# Patient Record
Sex: Male | Born: 1995 | Race: White | Hispanic: No | Smoking: Former smoker
Health system: Southern US, Community
[De-identification: ages and names within clinical notes are randomized; demographics above are authoritative.]

## PROBLEM LIST (undated history)

## (undated) DIAGNOSIS — K219 Gastro-esophageal reflux disease without esophagitis: Secondary | ICD-10-CM

## (undated) DIAGNOSIS — T7840XA Allergy, unspecified, initial encounter: Secondary | ICD-10-CM

## (undated) DIAGNOSIS — I10 Essential (primary) hypertension: Secondary | ICD-10-CM

## (undated) DIAGNOSIS — R42 Dizziness and giddiness: Secondary | ICD-10-CM

## (undated) HISTORY — DX: Gastro-esophageal reflux disease without esophagitis: K21.9

## (undated) HISTORY — DX: Essential (primary) hypertension: I10

## (undated) HISTORY — PX: ABDOMINAL WALL DEFECT REPAIR: SHX53

## (undated) HISTORY — DX: Allergy, unspecified, initial encounter: T78.40XA

---
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
## 2017-02-07 ENCOUNTER — Encounter: Payer: Self-pay | Admitting: Emergency Medicine

## 2017-02-07 ENCOUNTER — Emergency Department: Payer: BLUE CROSS/BLUE SHIELD

## 2017-02-07 ENCOUNTER — Emergency Department
Admission: EM | Admit: 2017-02-07 | Discharge: 2017-02-07 | Disposition: A | Discharge status: Home / Self Care | Payer: BLUE CROSS/BLUE SHIELD | Attending: Emergency Medicine | Admitting: Emergency Medicine

## 2017-02-07 DIAGNOSIS — R091 Pleurisy: Secondary | ICD-10-CM | POA: Diagnosis not present

## 2017-02-07 DIAGNOSIS — R079 Chest pain, unspecified: Secondary | ICD-10-CM | POA: Diagnosis present

## 2017-02-07 DIAGNOSIS — J00 Acute nasopharyngitis [common cold]: Secondary | ICD-10-CM | POA: Insufficient documentation

## 2017-02-07 DIAGNOSIS — R0789 Other chest pain: Secondary | ICD-10-CM

## 2017-02-07 DIAGNOSIS — Z87891 Personal history of nicotine dependence: Secondary | ICD-10-CM | POA: Insufficient documentation

## 2017-02-07 LAB — CBC
HEMATOCRIT: 45.6 % (ref 40.0–52.0)
HEMOGLOBIN: 14.9 g/dL (ref 13.0–18.0)
MCH: 29.4 pg (ref 26.0–34.0)
MCHC: 32.7 g/dL (ref 32.0–36.0)
MCV: 89.8 fL (ref 80.0–100.0)
Platelets: 197 10*3/uL (ref 150–440)
RBC: 5.08 MIL/uL (ref 4.40–5.90)
RDW: 13 % (ref 11.5–14.5)
WBC: 12 10*3/uL — AB (ref 3.8–10.6)

## 2017-02-07 LAB — FIBRIN DERIVATIVES D-DIMER (ARMC ONLY): FIBRIN DERIVATIVES D-DIMER (ARMC): 228.77 ng{FEU}/mL (ref 0.00–499.00)

## 2017-02-07 LAB — BASIC METABOLIC PANEL
ANION GAP: 9 (ref 5–15)
BUN: 11 mg/dL (ref 6–20)
CO2: 30 mmol/L (ref 22–32)
Calcium: 9.3 mg/dL (ref 8.9–10.3)
Chloride: 99 mmol/L — ABNORMAL LOW (ref 101–111)
Creatinine, Ser: 1.02 mg/dL (ref 0.61–1.24)
GFR calc Af Amer: >60 mL/min (ref >60)
Glucose, Bld: 134 mg/dL — ABNORMAL HIGH (ref 65–99)
POTASSIUM: 3.7 mmol/L (ref 3.5–5.1)
SODIUM: 138 mmol/L (ref 135–145)

## 2017-02-07 LAB — TROPONIN I: Troponin I: <0.03 ng/mL (ref <0.03)

## 2017-02-07 MED ORDER — BENZONATATE 100 MG PO CAPS: 100.0000 mg | ORAL_CAPSULE | Freq: Four times a day (QID) | ORAL | 0 refills | Status: DC | PRN

## 2017-02-07 MED ORDER — TRAMADOL HCL 50 MG PO TABS
50.0000 mg | ORAL_TABLET | Freq: Once | ORAL | Status: AC
  Administered 2017-02-07: 50 mg via ORAL

## 2017-02-07 MED ORDER — KETOROLAC TROMETHAMINE 60 MG/2ML IM SOLN
INTRAMUSCULAR | Status: AC
  Filled 2017-02-07: qty 2

## 2017-02-07 MED ORDER — TRAMADOL HCL 50 MG PO TABS: 50.0000 mg | ORAL_TABLET | Freq: Four times a day (QID) | ORAL | 0 refills | Status: DC | PRN

## 2017-02-07 MED ORDER — KETOROLAC TROMETHAMINE 60 MG/2ML IM SOLN
60.0000 mg | Freq: Once | INTRAMUSCULAR | Status: AC
  Administered 2017-02-07: 60 mg via INTRAMUSCULAR

## 2017-02-07 MED ORDER — TRAMADOL HCL 50 MG PO TABS
ORAL_TABLET | ORAL | Status: AC
  Filled 2017-02-07: qty 1

## 2017-02-07 NOTE — Discharge Instructions (Signed)
Please follow up with your primary care physician for further evaluation of your chest pain. Your workup tonight is unremarkable.

## 2017-02-07 NOTE — ED Notes (Signed)

## 2017-02-07 NOTE — ED Triage Notes (Signed)
Pt presents to ED after he awoken from sleep with c/o sharp chest pain across his upper chest and sob. Pt states he was seen by his pcp earlier today cough and congestionand was dx with a sinus infection. Antibiotics started. No hx of the same. Pt appears anxious.

## 2017-02-07 NOTE — ED Provider Notes (Signed)
[facility] Emergency Department Provider Note   ____________________________________________   First MD Initiated Contact with Patient 02/07/17 [phone]     (approximate)  I have reviewed the triage vital signs and the nursing notes.   HISTORY  Chief Complaint Chest Pain    HPI Danny [name] is a 21 y.o. male who comes into the hospital today with chest pain. He reports that woke him up out of his sleep. He reports that he's had some shortness of breath and nausea with no vomiting. The patient denies any strenuous activity over the last few days. He also denies any radiation or dizziness and lightheadedness. The patient states that he did not take any medication before coming into the hospital. He's had a cough and was sick for the past week. He just got on antibiotics today. He reports that his chest hurts to touch on the left side anterior and lateral. He reports that coughing also makes it worse. The patient rates his pain a 5 out of 10 in intensity. The patient is here today for evaluation.    History reviewed. No pertinent past medical history.  Patient Active Problem List   Diagnosis Date Noted  . Cellulitis of right hand 06/08/2015    Past Surgical History:  Procedure Laterality Date  . ABDOMINAL WALL DEFECT REPAIR      Prior to Admission medications   Medication Sig Start Date End Date Taking? Authorizing Provider  amoxicillin-clavulanate (AUGMENTIN) 875-125 MG tablet Take 1 tablet by mouth 2 (two) times daily. 02/06/17 02/16/17 Yes [provider]  chlorpheniramine-HYDROcodone (TUSSIONEX) 10-8 MG/5ML SUER Take 5 mLs by mouth every 12 (twelve) hours as needed for cough. 02/06/17  Yes [provider]  benzonatate (TESSALON PERLES) 100 MG capsule Take 1 capsule (100 mg total) by mouth every 6 (six) hours as needed for cough. 02/07/17   Rebecka Apley, MD  traMADol (ULTRAM) 50 MG tablet Take 1 tablet (50 mg total) by mouth every  6 (six) hours as needed. 02/07/17   Rebecka Apley, MD    Allergies Patient has no known allergies.  Family History  Problem Relation Age of Onset  . Hypertension Maternal Grandmother     Social History Social History  Substance Use Topics  . Smoking status: Former Smoker    Quit date: 02/07/2014  . Smokeless tobacco: Not on file  . Alcohol use Yes    Review of Systems  Constitutional: No fever/chills Eyes: No visual changes. ENT: No sore throat. Cardiovascular:  chest pain. Respiratory: shortness of breath. Gastrointestinal: No abdominal pain.  No nausea, no vomiting.  No diarrhea.  No constipation. Genitourinary: Negative for dysuria. Musculoskeletal: Negative for back pain. Skin: Negative for rash. Neurological: Negative for headaches, focal weakness or numbness.   ____________________________________________   PHYSICAL EXAM:  VITAL SIGNS: ED Triage Vitals  Enc Vitals Group     BP 02/07/17 0047 (!) 151/95     Pulse Rate 02/07/17 0047 62     Resp 02/07/17 0047 20     Temp 02/07/17 0047 98.3 F (36.8 C)     Temp Source 02/07/17 0047 Oral     SpO2 02/07/17 0047 100 %     Weight 02/07/17 0043 180 lb (81.6 kg)     Height 02/07/17 0043 5\' 10"  (1.778 m)     Head Circumference --      Peak Flow --      Pain Score 02/07/17 0042 8     Pain Loc --  Pain Edu? --      Excl. in GC? --     Constitutional: Alert and oriented. Well appearing and in mild distress. Eyes: Conjunctivae are normal. PERRL. EOMI. Head: Atraumatic. Nose: No congestion/rhinnorhea. Mouth/Throat: Mucous membranes are moist.  Oropharynx non-erythematous. postnasal drip. Cardiovascular: Normal rate, regular rhythm. Grossly normal heart sounds.  Good peripheral circulation. Respiratory: Normal respiratory effort.  No retractions. Lungs CTAB. Left chest wall tenderness to palpation, left lateral chest wall tenderness. Gastrointestinal: Soft and nontender. No distention. positive bowel  sounds Musculoskeletal: No lower extremity tenderness nor edema.   Neurologic:  Normal speech and language.  Skin:  Skin is warm, dry and intact.  Psychiatric: Mood and affect are normal.   ____________________________________________   LABS (all labs ordered are listed, but only abnormal results are displayed)  Labs Reviewed  BASIC METABOLIC PANEL - Abnormal; Notable for the following:       Result Value   Chloride 99 (*)    Glucose, Bld 134 (*)    All other components within normal limits  CBC - Abnormal; Notable for the following:    WBC 12.0 (*)    All other components within normal limits  TROPONIN I  FIBRIN DERIVATIVES D-DIMER ([facility] ONLY)  TROPONIN I   ____________________________________________  EKG  ED ECG REPORT I, Rebecka Apley, the attending physician, personally viewed and interpreted this ECG.   Date: 02/07/2017  EKG Time: 0039  Rate: 60  Rhythm: normal sinus rhythm  Axis: left axis deviation  Intervals:none  ST&T Change: flipped t waves in lead III  ____________________________________________  RADIOLOGY  Dg Chest 2 View  Result Date: 02/07/2017 CLINICAL DATA:  Acute onset of sharp upper chest pain and shortness of breath. Cough and congestion. Initial encounter. EXAM: CHEST  2 VIEW COMPARISON:  CT of the chest performed 01/21/2013 FINDINGS: The lungs are well-aerated and clear. There is no evidence of focal opacification, pleural effusion or pneumothorax. The heart is normal in size; the mediastinal contour is within normal limits. No acute osseous abnormalities are seen. IMPRESSION: No acute cardiopulmonary process seen. Electronically Signed   By: Roanna Raider M.D.   On: 02/07/2017 01:04    ____________________________________________   PROCEDURES  Procedure(s) performed: None  Procedures  Critical Care performed: No  ____________________________________________   INITIAL IMPRESSION / ASSESSMENT AND PLAN / ED COURSE  As part  of my medical decision making, I reviewed the following data within the electronic MEDICAL RECORD NUMBER Notes from prior ED visits and [place] Controlled Substance Database   this is a 21 year old male who comes into the hospital today with some left-sided chest pain. The patient has been sick with upper respiratory infectious symptoms for the past week.  The differential diagnosis includes costochondritis, pleurisy, acute coronary syndrome, pulmonary embolus, pneumonia.  We did check some blood work on the patient which was fairly unremarkable. The patient's white blood cell count was 12. He had a chest x-ray which was negative. We did check a d-dimer and 2 troponins on the patient which were both negative. I gave the patient at shot of Toradol and some tramadol. His pain was improved. I feel the patient has some pleurisy as his pain is more lateral than central. He will be discharged home with some pain medication and should follow-up with his primary care physician.      ____________________________________________   FINAL CLINICAL IMPRESSION(S) / ED DIAGNOSES  Final diagnoses:  Chest wall pain  Chest pain, unspecified type  Acute nasopharyngitis  Pleurisy      NEW MEDICATIONS STARTED DURING THIS VISIT:  Discharge Medication List as of 02/07/2017  5:15 AM    START taking these medications   Details  benzonatate (TESSALON PERLES) 100 MG capsule Take 1 capsule (100 mg total) by mouth every 6 (six) hours as needed for cough., Starting Tue 02/07/2017, Print    traMADol (ULTRAM) 50 MG tablet Take 1 tablet (50 mg total) by mouth every 6 (six) hours as needed., Starting Tue 02/07/2017, Print         Note:  This document was prepared using Dragon voice recognition software and may include unintentional dictation errors.    Rebecka ApleyWebster, Aqu[place]ah Devin P, MD 02/07/17 0800

## 2020-09-01 DIAGNOSIS — H109 Unspecified conjunctivitis: Secondary | ICD-10-CM | POA: Diagnosis not present

## 2020-09-17 DIAGNOSIS — J0100 Acute maxillary sinusitis, unspecified: Secondary | ICD-10-CM | POA: Diagnosis not present

## 2020-11-19 DIAGNOSIS — S93401A Sprain of unspecified ligament of right ankle, initial encounter: Secondary | ICD-10-CM | POA: Diagnosis not present

## 2021-02-16 DIAGNOSIS — M542 Cervicalgia: Secondary | ICD-10-CM | POA: Diagnosis not present

## 2021-03-08 ENCOUNTER — Emergency Department: Payer: BC Managed Care – PPO

## 2021-03-08 ENCOUNTER — Encounter: Payer: Self-pay | Admitting: Emergency Medicine

## 2021-03-08 DIAGNOSIS — R079 Chest pain, unspecified: Secondary | ICD-10-CM | POA: Diagnosis not present

## 2021-03-08 DIAGNOSIS — R0602 Shortness of breath: Secondary | ICD-10-CM | POA: Diagnosis not present

## 2021-03-08 DIAGNOSIS — R112 Nausea with vomiting, unspecified: Secondary | ICD-10-CM | POA: Diagnosis not present

## 2021-03-08 DIAGNOSIS — R1013 Epigastric pain: Secondary | ICD-10-CM | POA: Diagnosis not present

## 2021-03-08 DIAGNOSIS — F419 Anxiety disorder, unspecified: Secondary | ICD-10-CM | POA: Diagnosis not present

## 2021-03-08 DIAGNOSIS — R0789 Other chest pain: Secondary | ICD-10-CM | POA: Diagnosis not present

## 2021-03-08 DIAGNOSIS — R002 Palpitations: Secondary | ICD-10-CM | POA: Diagnosis not present

## 2021-03-08 DIAGNOSIS — Z87891 Personal history of nicotine dependence: Secondary | ICD-10-CM | POA: Diagnosis not present

## 2021-03-08 NOTE — ED Triage Notes (Addendum)
Pt in with palpitations at home, states HR was176 on Apple Watch. Also c/o some central chest discomfort. States he was sitting, and had just gotten up to go walk to the kitchen, when his HR jumped to this rate. Intermittent cp and palpitations now. C/o some sob and nausea. HR 85 in triage. Endorses 4 beers PTA, typical daily amount

## 2021-03-09 ENCOUNTER — Emergency Department
Admission: EM | Admit: 2021-03-09 | Discharge: 2021-03-09 | Disposition: A | Discharge status: Home / Self Care | Payer: BC Managed Care – PPO | Attending: Emergency Medicine | Admitting: Emergency Medicine

## 2021-03-09 DIAGNOSIS — R002 Palpitations: Secondary | ICD-10-CM

## 2021-03-09 LAB — BASIC METABOLIC PANEL
Anion gap: 8 (ref 5–15)
BUN: 14 mg/dL (ref 6–20)
CO2: 27 mmol/L (ref 22–32)
Calcium: 8.6 mg/dL — ABNORMAL LOW (ref 8.9–10.3)
Chloride: 102 mmol/L (ref 98–111)
Creatinine, Ser: 0.82 mg/dL (ref 0.61–1.24)
GFR, Estimated: >60 mL/min (ref >60)
Glucose, Bld: 111 mg/dL — ABNORMAL HIGH (ref 70–99)
Potassium: 4 mmol/L (ref 3.5–5.1)
Sodium: 137 mmol/L (ref 135–145)

## 2021-03-09 LAB — CBC
HCT: 45.7 % (ref 39.0–52.0)
Hemoglobin: 15 g/dL (ref 13.0–17.0)
MCH: 30.1 pg (ref 26.0–34.0)
MCHC: 32.8 g/dL (ref 30.0–36.0)
MCV: 91.8 fL (ref 80.0–100.0)
Platelets: 215 10*3/uL (ref 150–400)
RBC: 4.98 MIL/uL (ref 4.22–5.81)
RDW: 12.7 % (ref 11.5–15.5)
WBC: 9.5 10*3/uL (ref 4.0–10.5)
nRBC: 0 % (ref 0.0–0.2)

## 2021-03-09 LAB — TROPONIN I (HIGH SENSITIVITY): Troponin I (High Sensitivity): 2 ng/L (ref <18)

## 2021-03-09 NOTE — ED Provider Notes (Signed)
[facility]  ____________________________________________   Event Date/Time   First MD Initiated Contact with Patient 03/09/21 0207     (approximate)  I have reviewed the triage vital signs and the nursing notes.   HISTORY  Chief Complaint Chest Pain    HPI Danny [name] is a 25 y.o. male with no significant past medical history presents with an episode of palpitations.  Symptoms started acutely tonight.  Patient saw that his heart rate was elevated in the 140s to 170s on his apple watch.  Then felt anxious and short of breath with some nausea and an episode of emesis.  Also endorses some central chest/epigastric discomfort.  This resolved after period of about 45 minutes.  Denies history of similar.  He denies any ongoing chest pain dyspnea or palpitations.  No fevers or chills.  No lightheadedness or syncope.  He does drink 3-4 beers daily and he had done this tonight.  No other drug use.  She notes that his apple watch did tell him that he was in a normal rhythm.      History reviewed. No pertinent past medical history.  Patient Active Problem List   Diagnosis Date Noted   Cellulitis of right hand 06/08/2015    Past Surgical History:  Procedure Laterality Date   ABDOMINAL WALL DEFECT REPAIR      Prior to Admission medications   Medication Sig Start Date End Date Taking? Authorizing Provider  benzonatate (TESSALON PERLES) 100 MG capsule Take 1 capsule (100 mg total) by mouth every 6 (six) hours as needed for cough. 02/07/17   Rebecka Apley, MD  chlorpheniramine-HYDROcodone (TUSSIONEX) 10-8 MG/5ML SUER Take 5 mLs by mouth every 12 (twelve) hours as needed for cough. 02/06/17   [provider]  traMADol (ULTRAM) 50 MG tablet Take 1 tablet (50 mg total) by mouth every 6 (six) hours as needed. 02/07/17   Rebecka Apley, MD    Allergies Patient has no known allergies.  Family History  Problem Relation Age of Onset    Hypertension Maternal Grandmother     Social History Social History   Tobacco Use   Smoking status: Former    Types: Cigarettes    Quit date: 02/07/2014    Years since quitting: 7.0  Vaping Use   Vaping Use: Some days  Substance Use Topics   Alcohol use: Yes    Comment: occasional   Drug use: Yes    Types: Marijuana    Review of Systems   Review of Systems  Constitutional:  Negative for chills and fever.  Respiratory:  Positive for chest tightness and shortness of breath.   Cardiovascular:  Positive for chest pain and palpitations. Negative for leg swelling.  Gastrointestinal:  Positive for abdominal pain, nausea and vomiting.  Psychiatric/Behavioral:  The patient is nervous/anxious.   All other systems reviewed and are negative.  Physical Exam Updated Vital Signs BP 116/80 (BP Location: Right Arm)   Pulse 62   Temp 98.5 F (36.9 C) (Oral)   Resp 18   Wt 95.3 kg   SpO2 100%   BMI 30.13 kg/m   Physical Exam Vitals and nursing note reviewed.  Constitutional:      General: He is not in acute distress.    Appearance: Normal appearance.  HENT:     Head: Normocephalic and atraumatic.  Eyes:     General: No scleral icterus.    Conjunctiva/sclera: Conjunctivae normal.  Cardiovascular:     Rate and  Rhythm: Normal rate and regular rhythm.     Heart sounds: Normal heart sounds.  Pulmonary:     Effort: Pulmonary effort is normal. No respiratory distress.     Breath sounds: Normal breath sounds. No wheezing.  Abdominal:     Palpations: Abdomen is soft.     Tenderness: There is no abdominal tenderness. There is no guarding.  Musculoskeletal:        General: No deformity or signs of injury.     Cervical back: Normal range of motion.     Right lower leg: No edema.     Left lower leg: No edema.  Skin:    Coloration: Skin is not jaundiced or pale.  Neurological:     General: No focal deficit present.     Mental Status: He is alert and oriented to person, place, and  time. Mental status is at baseline.  Psychiatric:        Mood and Affect: Mood normal.        Behavior: Behavior normal.     LABS (all labs ordered are listed, but only abnormal results are displayed)  Labs Reviewed  BASIC METABOLIC PANEL - Abnormal; Notable for the following components:      Result Value   Glucose, Bld 111 (*)    Calcium 8.6 (*)    All other components within normal limits  CBC  TROPONIN I (HIGH SENSITIVITY)   ____________________________________________  EKG  NSR, nml axis, nml intervals, no acute ischemic changes  ____________________________________________  RADIOLOGY Ky Barban, personally viewed and evaluated these images (plain radiographs) as part of my medical decision making, as well as reviewing the written report by the radiologist.  ED MD interpretation:  I reviewed the CXR which does not show any acute cardiopulmonary process      ____________________________________________   PROCEDURES  Procedure(s) performed (including Critical Care):  Procedures   ____________________________________________   INITIAL IMPRESSION / ASSESSMENT AND PLAN / ED COURSE   25 year old male presents after an episode of palpitations that is now resolved.  Heart rate noted to be in the 140s to 170s on his apple watch and was associated with nausea and episode of emesis, anxiety dyspnea and chest pain.  This resolved after period of 45 minutes and he is now asymptomatic.  His vital signs are all within normal limits.  He is extremely well-appearing on exam normal cardiopulmonary exam no signs of DVT.  His EKG shows normal sinus rhythm without any concerning findings.  Troponin was obtained from triage along with other basic labs which were all normal troponin is negative.  Possible that this was an episode of A. fib or SVT especially given the patient's heavy alcohol use.  He is in normal sinus rhythm and asymptomatic no further work-up indicated at  this time.  I did recommend that if he has recurrent episodes that he should book follow-up with his primary care provider and likely see cardiology to have further long-term monitoring to rule out arrhythmia.  He is otherwise stable for discharge.      ____________________________________________   FINAL CLINICAL IMPRESSION(S) / ED DIAGNOSES  Final diagnoses:  Palpitations     ED Discharge Orders     None        Note:  This document was prepared using Dragon voice recognition software and may include unintentional dictation errors.    Georga Hacking, MD 03/09/21 [phone]

## 2021-03-09 NOTE — Discharge Instructions (Addendum)
Your EKG shows that you are in a normal sinus rhythm.  Your chest x-ray was also normal.  We checked your cardiac enzymes, counts and electrolytes which were all reassuring.  It was possible that you had a transient arrhythmia that was the cause of your symptoms.  If you have another episode like this I would want you to follow-up with your primary care provider and potentially see cardiologist as you may need to have more long-term monitoring to make sure you do not have any underlying arrhythmias.

## 2021-09-02 ENCOUNTER — Encounter: Payer: Self-pay | Admitting: Emergency Medicine

## 2021-09-02 ENCOUNTER — Emergency Department
Admission: EM | Admit: 2021-09-02 | Discharge: 2021-09-02 | Disposition: A | Discharge status: Home / Self Care | Payer: BC Managed Care – PPO | Attending: Emergency Medicine | Admitting: Emergency Medicine

## 2021-09-02 ENCOUNTER — Other Ambulatory Visit: Payer: Self-pay

## 2021-09-02 ENCOUNTER — Emergency Department: Payer: BC Managed Care – PPO

## 2021-09-02 DIAGNOSIS — R079 Chest pain, unspecified: Secondary | ICD-10-CM | POA: Diagnosis not present

## 2021-09-02 DIAGNOSIS — R0789 Other chest pain: Secondary | ICD-10-CM | POA: Diagnosis not present

## 2021-09-02 LAB — BASIC METABOLIC PANEL
Anion gap: 6 (ref 5–15)
BUN: 13 mg/dL (ref 6–20)
CO2: 29 mmol/L (ref 22–32)
Calcium: 9.1 mg/dL (ref 8.9–10.3)
Chloride: 104 mmol/L (ref 98–111)
Creatinine, Ser: 0.99 mg/dL (ref 0.61–1.24)
GFR, Estimated: >60 mL/min (ref >60)
Glucose, Bld: 95 mg/dL (ref 70–99)
Potassium: 3.5 mmol/L (ref 3.5–5.1)
Sodium: 139 mmol/L (ref 135–145)

## 2021-09-02 LAB — CBC
HCT: 47.8 % (ref 39.0–52.0)
Hemoglobin: 15.3 g/dL (ref 13.0–17.0)
MCH: 28.9 pg (ref 26.0–34.0)
MCHC: 32 g/dL (ref 30.0–36.0)
MCV: 90.2 fL (ref 80.0–100.0)
Platelets: 210 10*3/uL (ref 150–400)
RBC: 5.3 MIL/uL (ref 4.22–5.81)
RDW: 12.9 % (ref 11.5–15.5)
WBC: 9 10*3/uL (ref 4.0–10.5)
nRBC: 0 % (ref 0.0–0.2)

## 2021-09-02 LAB — TROPONIN I (HIGH SENSITIVITY): Troponin I (High Sensitivity): <2 ng/L (ref <18)

## 2021-09-02 MED ORDER — ALUM & MAG HYDROXIDE-SIMETH 200-200-20 MG/5ML PO SUSP
30.0000 mL | Freq: Once | ORAL | Status: AC
  Administered 2021-09-02: 30 mL via ORAL
  Filled 2021-09-02: qty 30

## 2021-09-02 MED ORDER — LIDOCAINE VISCOUS HCL 2 % MT SOLN
15.0000 mL | Freq: Once | OROMUCOSAL | Status: AC
  Administered 2021-09-02: 15 mL via ORAL
  Filled 2021-09-02: qty 15

## 2021-09-02 NOTE — ED Provider Notes (Signed)
3:54 PM Assumed care for off going team.   Blood pressure 132/90, pulse 75, temperature 98.2 F (36.8 C), temperature source Oral, resp. rate 15, height 5\' 10"  (1.778 m), weight 95 kg, SpO2 100 %.  See their HPI for full report but in brief pending labs and discharge    BMP is normal.  CBC without evidence of anemia.  Troponin is negative and I reviewed the initial doctor's note and the pain started yesterday at 10 PM and so should be accurate given low heart score does not need repeat.  Patient denies any pain at this time feels comfortable discharge and reports already having cardiology follow-up at the end of the month.     , MD 09/02/21 [phone]

## 2021-09-02 NOTE — ED Provider Notes (Signed)
[facility] Provider Note    Event Date/Time   First MD Initiated Contact with Patient 09/02/21 1326     (approximate)   History   Chest Pain and Shortness of Breath (/)   HPI  Danny [name] is a 26 y.o. male with no significant past medical history presents with complaints of chest discomfort.  Patient reports last night around 10 PM when he was lying down in bed he developed discomfort in his chest.  He does report a history of acid reflux and does take Tums for that frequently.  He reports the discomfort continues today.  Denies fevers or chills, no shortness of breath or pleurisy.  Has not take anything for this.  No history of heart disease     Physical Exam   Triage Vital Signs: ED Triage Vitals  Enc Vitals Group     BP 09/02/21 1309 132/90     Pulse Rate 09/02/21 1309 75     Resp 09/02/21 1309 15     Temp 09/02/21 1309 98.2 F (36.8 C)     Temp Source 09/02/21 1309 Oral     SpO2 09/02/21 1309 100 %     Weight 09/02/21 1304 95 kg (209 lb 7 oz)     Height 09/02/21 1304 1.778 m (5\' 10" )     Head Circumference --      Peak Flow --      Pain Score 09/02/21 1304 2     Pain Loc --      Pain Edu? --      Excl. in GC? --     Most recent vital signs: Vitals:   09/02/21 1309  BP: 132/90  Pulse: 75  Resp: 15  Temp: 98.2 F (36.8 C)  SpO2: 100%     General: Awake, no distress.  CV:  Good peripheral perfusion.  No chest wall rash or tenderness palpation, regular rate and rhythm Resp:  Normal effort.  CTA bilaterally Abd:  No distention.  Other:     ED Results / Procedures / Treatments   Labs (all labs ordered are listed, but only abnormal results are displayed) Labs Reviewed  CBC  BASIC METABOLIC PANEL  TROPONIN I (HIGH SENSITIVITY)     EKG  ED ECG REPORT I, 09/04/21, the attending physician, personally viewed and interpreted this ECG.  Date: 09/02/2021  Rhythm: normal sinus rhythm QRS Axis: normal Intervals:  normal ST/T Wave abnormalities: normal Narrative Interpretation: no evidence of acute ischemia    RADIOLOGY Chest x-ray interpreted by me, no acute abnormality    PROCEDURES:  Critical Care performed:   Procedures   MEDICATIONS ORDERED IN ED: Medications  alum & mag hydroxide-simeth (MAALOX/MYLANTA) 200-200-20 MG/5ML suspension 30 mL (30 mLs Oral Given 09/02/21 1408)    And  lidocaine (XYLOCAINE) 2 % viscous mouth solution 15 mL (15 mLs Oral Given 09/02/21 1408)     IMPRESSION / MDM / ASSESSMENT AND PLAN / ED COURSE  I reviewed the triage vital signs and the nursing notes. Patient's presentation is most consistent with acute illness / injury with system symptoms.   Patient presents with chest pain as detailed above.  Very low risk for ACS given his age and minimal risk factors.  No drug use.  Differential includes GERD, myocarditis, pneumonia  Obtain labs, chest x-ray give GI cocktail and reevaluate  Lab work thus far is reassuring, pending high sensitive troponin  X-ray without evidence of pneumonia or pneumothorax  Asked my colleague  to follow-up on lab results, anticipate discharge if reassuring results       FINAL CLINICAL IMPRESSION(S) / ED DIAGNOSES   Final diagnoses:  Atypical chest pain     Rx / DC Orders   ED Discharge Orders     None        Note:  This document was prepared using Dragon voice recognition software and may include unintentional dictation errors.   Jene Every, MD 09/02/21 1501

## 2021-09-02 NOTE — ED Notes (Signed)
See triage note. Chest "pressure" started yesterday with accompanying SoB. Sx are intermittent but have been becoming progressively worse and longer in duration. Pt denies any NVD, fever/chills. Denies any medication changes or use of OTC supplements. Pt has been taking OTC benadryl for last 4 days due to seasonal allergies. At time of assessment, pt A&Ox4, NAD.

## 2021-09-02 NOTE — ED Notes (Signed)
Called lab for missing bloodwork. Pt insists blood was already drawn in triage. Lab has not received yet. Will check back.

## 2021-09-02 NOTE — Discharge Instructions (Addendum)
Follow-up with your cardiologist as planned and return to the ER if you develop worsening symptoms, fevers or any other concerns

## 2021-09-02 NOTE — ED Triage Notes (Signed)
Pt via POV from home. Pt c/o mid to L sided chest pain, SOB, shakiness, and states he randomly "stares off into space." Pt states that it started yesterday. Pt is A&Ox4 and NAD

## 2022-05-20 DIAGNOSIS — R030 Elevated blood-pressure reading, without diagnosis of hypertension: Secondary | ICD-10-CM | POA: Diagnosis not present

## 2022-05-20 DIAGNOSIS — R002 Palpitations: Secondary | ICD-10-CM | POA: Diagnosis not present

## 2022-05-26 ENCOUNTER — Ambulatory Visit: Payer: BC Managed Care – PPO | Attending: Cardiology | Admitting: Cardiology

## 2022-05-26 ENCOUNTER — Encounter: Payer: Self-pay | Admitting: Cardiology

## 2022-05-26 VITALS — BP 128/84 | HR 71 | Ht 71.0 in | Wt 202.0 lb

## 2022-05-26 DIAGNOSIS — R42 Dizziness and giddiness: Secondary | ICD-10-CM | POA: Diagnosis not present

## 2022-05-26 DIAGNOSIS — R072 Precordial pain: Secondary | ICD-10-CM | POA: Diagnosis not present

## 2022-05-26 DIAGNOSIS — Z7289 Other problems related to lifestyle: Secondary | ICD-10-CM | POA: Diagnosis not present

## 2022-05-26 DIAGNOSIS — I10 Essential (primary) hypertension: Secondary | ICD-10-CM | POA: Diagnosis not present

## 2022-05-26 NOTE — Patient Instructions (Signed)
Medication Instructions:   Your physician recommends that you continue on your current medications as directed. Please refer to the Current Medication list given to you today.  *If you need a refill on your cardiac medications before your next appointment, please call your pharmacy*   Lab Work:  None Ordered  If you have labs (blood work) drawn today and your tests are completely normal, you will receive your results only by: [place] (if you have MyChart) OR A paper copy in the mail If you have any lab test that is abnormal or we need to change your treatment, we will call you to review the results.   Testing/Procedures:  Your physician has requested that you have an echocardiogram. Echocardiography is a painless test that uses sound waves to create images of your heart. It provides your doctor with information about the size and shape of your heart and how well your heart's chambers and valves are working. This procedure takes approximately one hour. There are no restrictions for this procedure. Please do NOT wear cologne, perfume, aftershave, or lotions (deodorant is allowed). Please arrive 15 minutes prior to your appointment time.    Follow-Up: At [facility], you and your health needs are our priority.  As part of our continuing mission to provide you with exceptional heart care, we have created designated Provider Care Teams.  These Care Teams include your primary Cardiologist (physician) and Advanced Practice Providers (APPs -  Physician Assistants and Nurse Practitioners) who all work together to provide you with the care you need, when you need it.  We recommend signing up for the patient portal called "MyChart".  Sign up information is provided on this After Visit Summary.  MyChart is used to connect with patients for Virtual Visits (Telemedicine).  Patients are able to view lab/test results, encounter notes, upcoming appointments, etc.  Non-urgent messages can  be sent to your provider as well.   To learn more about what you can do with MyChart, go to [email or web address].    Your next appointment:    After testing  Provider:   You may see Kate Sable, MD or one of the following Advanced Practice Providers on your designated Care Team:   Murray Hodgkins, NP Christell Faith, PA-C Cadence Kathlen Mody, PA-C Gerrie Nordmann, NP

## 2022-05-26 NOTE — Progress Notes (Signed)
Cardiology Office Note:    Date:  05/26/2022   ID:  Danny [name], DOB 1995-12-13, MRN [id number]  PCP:  Patient, No Pcp Per   [facility] Cardiologist:  Kate Sable, MD     Referring MD: No ref. provider found   Chief Complaint  Patient presents with   New Patient (Initial Visit)    HTN, No Hx, SOB, some chest [name], vertigo   History of Present Illness:    Danny [name] is a 27 y.o. male with a hx of hypertension, currently engages in e-cigarettes who presents with dizziness and chest [name].  He is having symptoms of chest [name] over the past month or so.  Chest [name] usually occurs after he has an episode of dizziness/vertigo.  He usually gets anxious after having symptoms, leading to chest discomfort.  He is very active with his job, occasionally running up and down stairs without chest [name] or shortness of breath.  Denies any personal or family history of heart attacks.  Has an appointment with primary care provider upcoming.  History reviewed. No pertinent past medical history.  Past Surgical History:  Procedure Laterality Date   ABDOMINAL WALL DEFECT REPAIR      Current Medications: Current Meds  Medication Sig   lisinopril (ZESTRIL) 10 MG tablet Take 10 mg by mouth daily.   Multiple Vitamin (MULTIVITAMIN) capsule Take 1 capsule by mouth daily.   omega-3 acid ethyl esters (LOVAZA) 1 g capsule Take 1 g by mouth 2 (two) times daily.     Allergies:   Patient has no known allergies.   Social History   Socioeconomic History   Marital status: Single    Spouse name: Not on file   Number of children: Not on file   Years of education: Not on file   Highest education level: Not on file  Occupational History   Not on file  Tobacco Use   Smoking status: Former    Types: Cigarettes    Quit date: 02/07/2014    Years since quitting: 8.3   Smokeless tobacco: Current    Types: Snuff  Vaping Use   Vaping Use: Some days  Substance and Sexual Activity    Alcohol use: Yes    Comment: occasional, once per week   Drug use: Not Currently    Types: Marijuana   Sexual activity: Not on file  Other Topics Concern   Not on file  Social History Narrative   Not on file   Social Determinants of Health   Financial Resource Strain: Not on file  Food Insecurity: Not on file  Transportation Needs: Not on file  Physical Activity: Not on file  Stress: Not on file  Social Connections: Not on file     Family History: The patient's family history includes Hypertension in his maternal grandmother.  ROS:   Please see the history of present illness.     All other systems reviewed and are negative.  EKGs/Labs/Other Studies Reviewed:    The following studies were reviewed today:   EKG:  EKG is  ordered today.  The ekg ordered today demonstrates normal sinus rhythm  Recent Labs: 09/02/2021: BUN 13; Creatinine, Ser 0.99; Hemoglobin 15.3; Platelets 210; Potassium 3.5; Sodium 139  Recent Lipid Panel No results found for: "CHOL", "TRIG", "HDL", "CHOLHDL", "VLDL", "LDLCALC", "LDLDIRECT"   Risk Assessment/Calculations:             Physical Exam:    VS:  BP 128/84 (BP Location: Right Arm)  Pulse 71   Ht 5\' 11"  (1.803 m)   Wt 202 lb (91.6 kg)   SpO2 (!) 71%   BMI 28.17 kg/m     Wt Readings from Last 3 Encounters:  05/26/22 202 lb (91.6 kg)  09/02/21 209 lb 7 oz (95 kg)  03/08/21 210 lb (95.3 kg)     GEN:  Well nourished, well developed in no acute distress HEENT: Normal NECK: No JVD; No carotid bruits CARDIAC: RRR, no murmurs, rubs, gallops RESPIRATORY:  Clear to auscultation without rales, wheezing or rhonchi  ABDOMEN: Soft, non-tender, non-distended MUSCULOSKELETAL:  No edema; No deformity  SKIN: Warm and dry NEUROLOGIC:  Alert and oriented x 3 PSYCHIATRIC:  Normal affect   ASSESSMENT:    1. Precordial [name]   2. Primary hypertension   3. Dizziness   4. Engages in [place]:    In order of problems listed  above:  Chest [name], appears noncardiac, anxiety related.  Get echocardiogram.  Patient educated on cardiac causes and symptoms for chest [name].  Consider additional testing if symptoms persist. Hypertension, low-salt diet advised.  Continue lisinopril. Dizziness, orthostatic vitals with no evidence for orthostasis, feel dizzy with changing position.  Symptoms concerning for positional vertigo.  Follow-up with PCP regarding management for vertigo and possible anxiety if diagnosed. Engages in [place], cessation advised.  Follow-up after echocardiogram or as needed if Echocardiogram is unrevealing.      Medication Adjustments/Labs and Tests Ordered: Current medicines are reviewed at length with the patient today.  Concerns regarding medicines are outlined above.  Orders Placed This Encounter  Procedures   EKG 12-Lead   ECHOCARDIOGRAM COMPLETE   No orders of the defined types were placed in this encounter.   Patient Instructions  Medication Instructions:   Your physician recommends that you continue on your current medications as directed. Please refer to the Current Medication list given to you today.  *If you need a refill on your cardiac medications before your next appointment, please call your pharmacy*   Lab Work:  None Ordered  If you have labs (blood work) drawn today and your tests are completely normal, you will receive your results only by: [place] (if you have MyChart) OR A paper copy in the mail If you have any lab test that is abnormal or we need to change your treatment, we will call you to review the results.   Testing/Procedures:  Your physician has requested that you have an echocardiogram. Echocardiography is a painless test that uses sound waves to create images of your heart. It provides your doctor with information about the size and shape of your heart and how well your heart's chambers and valves are working. This procedure takes approximately one  hour. There are no restrictions for this procedure. Please do NOT wear cologne, perfume, aftershave, or lotions (deodorant is allowed). Please arrive 15 minutes prior to your appointment time.    Follow-Up: At [facility], you and your health needs are our priority.  As part of our continuing mission to provide you with exceptional heart care, we have created designated Provider Care Teams.  These Care Teams include your primary Cardiologist (physician) and Advanced Practice Providers (APPs -  Physician Assistants and Nurse Practitioners) who all work together to provide you with the care you need, when you need it.  We recommend signing up for the patient portal called "MyChart".  Sign up information is provided on this After Visit Summary.  MyChart is used  to connect with patients for Virtual Visits (Telemedicine).  Patients are able to view lab/test results, encounter notes, upcoming appointments, etc.  Non-urgent messages can be sent to your provider as well.   To learn more about what you can do with MyChart, go to [email or web address].    Your next appointment:    After testing  Provider:   You may see Kate Sable, MD or one of the following Advanced Practice Providers on your designated Care Team:   Murray Hodgkins, NP Christell Faith, PA-C Cadence Kathlen Mody, PA-C Gerrie Nordmann, NP   Signed, Kate Sable, MD  05/26/2022 12:19 PM    [place]

## 2022-05-27 ENCOUNTER — Emergency Department (HOSPITAL_COMMUNITY): Payer: BC Managed Care – PPO

## 2022-05-27 ENCOUNTER — Emergency Department (HOSPITAL_COMMUNITY)
Admission: EM | Admit: 2022-05-27 | Discharge: 2022-05-27 | Disposition: A | Discharge status: Home / Self Care | Payer: BC Managed Care – PPO | Attending: Emergency Medicine | Admitting: Emergency Medicine

## 2022-05-27 ENCOUNTER — Encounter (HOSPITAL_COMMUNITY): Payer: Self-pay | Admitting: *Deleted

## 2022-05-27 ENCOUNTER — Other Ambulatory Visit: Payer: Self-pay

## 2022-05-27 DIAGNOSIS — Z87891 Personal history of nicotine dependence: Secondary | ICD-10-CM | POA: Diagnosis not present

## 2022-05-27 DIAGNOSIS — R42 Dizziness and giddiness: Secondary | ICD-10-CM | POA: Diagnosis not present

## 2022-05-27 DIAGNOSIS — I10 Essential (primary) hypertension: Secondary | ICD-10-CM | POA: Diagnosis not present

## 2022-05-27 DIAGNOSIS — Z79899 Other long term (current) drug therapy: Secondary | ICD-10-CM | POA: Diagnosis not present

## 2022-05-27 DIAGNOSIS — R079 Chest pain, unspecified: Secondary | ICD-10-CM | POA: Diagnosis not present

## 2022-05-27 DIAGNOSIS — H538 Other visual disturbances: Secondary | ICD-10-CM | POA: Diagnosis not present

## 2022-05-27 DIAGNOSIS — R5383 Other fatigue: Secondary | ICD-10-CM | POA: Diagnosis not present

## 2022-05-27 HISTORY — DX: Dizziness and giddiness: R42

## 2022-05-27 LAB — COMPREHENSIVE METABOLIC PANEL
ALT: 28 U/L (ref 0–44)
AST: 20 U/L (ref 15–41)
Albumin: 4.4 g/dL (ref 3.5–5.0)
Alkaline Phosphatase: 64 U/L (ref 38–126)
Anion gap: 10 (ref 5–15)
BUN: 13 mg/dL (ref 6–20)
CO2: 29 mmol/L (ref 22–32)
Calcium: 9 mg/dL (ref 8.9–10.3)
Chloride: 100 mmol/L (ref 98–111)
Creatinine, Ser: 0.91 mg/dL (ref 0.61–1.24)
GFR, Estimated: >60 mL/min (ref >60)
Glucose, Bld: 101 mg/dL — ABNORMAL HIGH (ref 70–99)
Potassium: 4.1 mmol/L (ref 3.5–5.1)
Sodium: 139 mmol/L (ref 135–145)
Total Bilirubin: 0.8 mg/dL (ref 0.3–1.2)
Total Protein: 7.6 g/dL (ref 6.5–8.1)

## 2022-05-27 LAB — CBC
HCT: 48.4 % (ref 39.0–52.0)
Hemoglobin: 15.6 g/dL (ref 13.0–17.0)
MCH: 29.5 pg (ref 26.0–34.0)
MCHC: 32.2 g/dL (ref 30.0–36.0)
MCV: 91.7 fL (ref 80.0–100.0)
Platelets: 215 10*3/uL (ref 150–400)
RBC: 5.28 MIL/uL (ref 4.22–5.81)
RDW: 12.7 % (ref 11.5–15.5)
WBC: 7.1 10*3/uL (ref 4.0–10.5)
nRBC: 0 % (ref 0.0–0.2)

## 2022-05-27 LAB — LIPASE, BLOOD: Lipase: 36 U/L (ref 11–51)

## 2022-05-27 NOTE — Discharge Instructions (Addendum)
Your testing has all been reassuring.  You may follow-up with your family doctor for any other concerns.  I would encourage you to eat a healthy diet, clear liquids, avoid alcohol and tobacco, return to the ER for severe or worsening symptoms.  Rest assured that your scan of your brain, the x-ray of your lungs and your blood work was all okay with no concerns.  It is not clear exactly what is causing this but if symptoms worsen you should return to the ER  Please follow-up with your family doctor this week, if you do not have a family doctor see the list below  [facility] Primary Care Doctor List    Tula Nakayama, MD. Specialty: [facility] Medicine Contact information: [address] [place] [address]  [phone]   Sallee Lange, MD. Specialty: Family Medicine Contact information: [place] [address]  [phone]   Rosita Fire, MD Specialty: Internal Medicine Contact information: [place] [address]  [phone]   Delphina Cahill, MD. Specialty: Internal Medicine Contact information: [place] [address]  [phone]    [facility] (Dr. Maudie Mercury) Specialty: Family Medicine Contact information: [place] [address]  [phone]   Leslie Andrea, MD. Specialty: [facility] Medicine Contact information: [place] [address]  [phone]   Asencion Noble, MD. Specialty: Internal Medicine Contact information: [place] [address]  [place] [address]  [place]  [address] [place], [place] [address] [phone]  Services [facility] offers a variety of basic health services.  Services include but are not limited to: Blood pressure checks  Heart rate checks  Blood sugar checks  Urine analysis  Rapid strep tests  Pregnancy tests.   Health education and referrals  People needing more complex services will be directed to a physician online. Using these virtual visits, doctors can evaluate and prescribe medicine and treatments. There will be no medication on-site, though [facility] will help patients fill their prescriptions at little to no cost.   For More information please go to: [email or web address]

## 2022-05-27 NOTE — ED Triage Notes (Signed)
Pt with c/o vertigo for past 2 weeks and fatigued.  Pt with hx of vertigo. Pt currently taking HTN medicine as well for past week. + mid CP since 1200 today. Has no PCP. Pt seen by his cardiologist for HTN and palpitations yesterday.

## 2022-05-27 NOTE — ED Provider Notes (Signed)
[facility] Provider Note   CSN: [id number] Arrival date & time: 05/27/22  1603     History  Chief Complaint  Patient presents with   Dizziness    Danny [name] is a 27 y.o. male.   Dizziness  This patient is a 27 year old male, he states he was recently diagnosed with hypertension and was placed on lisinopril, he does not take any other daily medications.  He stopped vaping about 1 month ago, no other drugs of abuse.  He reports that over the last couple of weeks he has had a feeling like he is getting dizzy when he eats a meal especially a big meal.  He describes the dizziness as feeling like he is in a fog or off balance.  There is no chest pain or abdominal pain or vomiting with this.  No swelling of the legs, no diarrhea constipation or urinary symptoms.  He does not have any postprandial abdominal pain.  The patient denies any vertigo and states the room is not spinning he has no feeling of inability to walk and he is still been able to walk and drive without difficulty even when he is having symptoms.  He does endorse having some mild blurred vision as well    Home Medications Prior to Admission medications   Medication Sig Start Date End Date Taking? Authorizing Provider  lisinopril (ZESTRIL) 10 MG tablet Take 10 mg by mouth daily. 05/20/22   [provider]  Multiple Vitamin (MULTIVITAMIN) capsule Take 1 capsule by mouth daily.    [provider]  omega-3 acid ethyl esters (LOVAZA) 1 g capsule Take 1 g by mouth 2 (two) times daily.    [provider]      Allergies    Patient has no known allergies.    Review of Systems   Review of Systems  Neurological:  Positive for dizziness.  All other systems reviewed and are negative.   Physical Exam Updated Vital Signs BP 135/88   Pulse 74   Temp 98 F (36.7 C) (Oral)   Resp 18   SpO2 100%  Physical Exam Vitals and nursing note reviewed.   Constitutional:      General: He is not in acute distress.    Appearance: He is well-developed.  HENT:     Head: Normocephalic and atraumatic.     Mouth/Throat:     Pharynx: No oropharyngeal exudate.  Eyes:     General: No scleral icterus.       Right eye: No discharge.        Left eye: No discharge.     Conjunctiva/sclera: Conjunctivae normal.     Pupils: Pupils are equal, round, and reactive to light.  Neck:     Thyroid: No thyromegaly.     Vascular: No JVD.  Cardiovascular:     Rate and Rhythm: Normal rate and regular rhythm.     Heart sounds: Normal heart sounds. No murmur heard.    No friction rub. No gallop.  Pulmonary:     Effort: Pulmonary effort is normal. No respiratory distress.     Breath sounds: Normal breath sounds. No wheezing or rales.  Abdominal:     General: Bowel sounds are normal. There is no distension.     Palpations: Abdomen is soft. There is no mass.     Tenderness: There is no abdominal tenderness.  Musculoskeletal:        General: No tenderness. Normal range of  motion.     Cervical back: Normal range of motion and neck supple.     Right lower leg: No edema.     Left lower leg: No edema.  Lymphadenopathy:     Cervical: No cervical adenopathy.  Skin:    General: Skin is warm and dry.     Findings: No erythema or rash.  Neurological:     Mental Status: He is alert.     Coordination: Coordination normal.     Comments: Speech is clear, cranial nerves III through XII are intact, memory is intact, strength is normal in all 4 extremities including grips, sensation is intact to light touch and pinprick in all 4 extremities. Coordination as tested by finger-nose-finger is normal, no limb ataxia. Normal gait, normal reflexes at the patellar tendons bilaterally  Psychiatric:        Behavior: Behavior normal.     ED Results / Procedures / Treatments   Labs (all labs ordered are listed, but only abnormal results are displayed) Labs Reviewed   COMPREHENSIVE METABOLIC PANEL - Abnormal; Notable for the following components:      Result Value   Glucose, Bld 101 (*)    All other components within normal limits  CBC  LIPASE, BLOOD    EKG EKG Interpretation  Date/Time:  Friday May 27 2022 16:29:53 EST Ventricular Rate:  73 PR Interval:  152 QRS Duration: 104 QT Interval:  378 QTC Calculation: 416 R Axis:   -32 Text Interpretation: Normal sinus rhythm Left axis deviation Minimal voltage criteria for LVH, may be normal variant ( R in aVL ) Abnormal ECG When compared with ECG of 02-Sep-2021 13:07, Incomplete right bundle branch block is no longer Present since last tracing no significant change Confirmed by Noemi Chapel [phone]) on 05/27/2022 4:32:19 PM  Radiology CT Head Wo Contrast  Result Date: 05/27/2022 CLINICAL DATA:  Blurry vision EXAM: CT HEAD WITHOUT CONTRAST TECHNIQUE: Contiguous axial images were obtained from the base of the skull through the vertex without intravenous contrast. RADIATION DOSE REDUCTION: This exam was performed according to the departmental dose-optimization program which includes automated exposure control, adjustment of the mA and/or kV according to patient size and/or use of iterative reconstruction technique. COMPARISON:  01/21/2013 FINDINGS: Brain: No evidence of acute infarction, hemorrhage, hydrocephalus, extra-axial collection or mass lesion/mass effect. Vascular: No hyperdense vessel or unexpected calcification. Skull: No osseous abnormality. Sinuses/Orbits: Visualized paranasal sinuses are clear. Visualized mastoid sinuses are clear. Visualized orbits demonstrate no focal abnormality. Other: None IMPRESSION: 1. No acute intracranial findings. Electronically Signed   By: Kathreen Devoid M.D.   On: 05/27/2022 18:19   DG Chest 2 View  Result Date: 05/27/2022 CLINICAL DATA:  Chest pain, 2 weeks of vertigo and fatigue EXAM: CHEST - 2 VIEW COMPARISON:  Chest radiograph Sep 02, 2021 FINDINGS: The heart  size and mediastinal contours are within normal limits. No focal consolidation. No pleural effusion. No pneumothorax. The visualized skeletal structures are unremarkable. IMPRESSION: No active cardiopulmonary disease. Electronically Signed   By: Dahlia Bailiff M.D.   On: 05/27/2022 16:45    Procedures Procedures    Medications Ordered in ED Medications - No data to display  ED Course/ Medical Decision Making/ A&P                             Medical Decision Making Amount and/or Complexity of Data Reviewed Labs: ordered. Radiology: ordered.   This patient presents to the ED  for concern of feeling of being off balance after eating, some persistent blurry vision, this involves an extensive number of treatment options, and is a complaint that carries with it a high risk of complications and morbidity.  The differential diagnosis includes hypertensive problems, could be brain mass, could be related to gastrointestinal symptoms though he has no pain or vomiting which makes it less likely.   Co morbidities that complicate the patient evaluation  Hypertension   Additional history obtained:  Additional history obtained from electronic medical record External records from outside source obtained and reviewed including he has been seen for chest pain and palpitations intermittently over the last couple of years without any objective findings.  Going back to 2015 the patient was admitted for snorting Xanax, drinking alcohol and smoking marijuana.   Lab Tests:  I Ordered, and personally interpreted labs.  The pertinent results include: CBC unremarkable, metabolic panel totally normal, lipase normal,   Imaging Studies ordered:  I ordered imaging studies including CT scan of the brain and chest x-ray I independently visualized and interpreted imaging which showed no acute findings I agree with the radiologist interpretation   Cardiac Monitoring: / EKG:  The patient was maintained on a  cardiac monitor.  I personally viewed and interpreted the cardiac monitored which showed an underlying rhythm of: Normal sinus rhythm  Problem List / ED Course / Critical interventions / Medication management  It is not clear exactly what is causing the patient's symptoms but he otherwise appears well, no medications needed, he is asymptomatic at this time I have reviewed the patients home medicines and have made adjustments as needed   Social Determinants of Health:  Recent smoker, has stopped   Test / Admission - Considered:  Considered admission but CT scan unremarkable, vital signs unremarkable, labs reassuring         Final Clinical Impression(s) / ED Diagnoses Final diagnoses:  Dizziness    Rx / DC Orders ED Discharge Orders     None         Noemi Chapel, MD 05/27/22 1836

## 2022-06-08 ENCOUNTER — Ambulatory Visit: Payer: BC Managed Care – PPO | Admitting: Family Medicine

## 2022-07-15 ENCOUNTER — Ambulatory Visit: Payer: BC Managed Care – PPO | Attending: Cardiology

## 2022-07-21 ENCOUNTER — Telehealth: Payer: Self-pay | Admitting: Physician Assistant

## 2022-07-22 ENCOUNTER — Ambulatory Visit: Payer: BC Managed Care – PPO | Admitting: Physician Assistant

## 2022-07-22 NOTE — Progress Notes (Deleted)
   New Patient Office Visit  Subjective    Patient ID: Danny [name], male    DOB: 11-Oct-1995  Age: 27 y.o. MRN: [id number]  CC: No chief complaint on file.   HPI [name] presents to establish care ***  Outpatient Encounter Medications as of 07/22/2022  Medication Sig   lisinopril (ZESTRIL) 10 MG tablet Take 10 mg by mouth daily.   Multiple Vitamin (MULTIVITAMIN) capsule Take 1 capsule by mouth daily.   omega-3 acid ethyl esters (LOVAZA) 1 g capsule Take 1 g by mouth 2 (two) times daily.   No facility-administered encounter medications on file as of 07/22/2022.    Past Medical History:  Diagnosis Date   Vertigo     Past Surgical History:  Procedure Laterality Date   ABDOMINAL WALL DEFECT REPAIR      Family History  Problem Relation Age of Onset   Hypertension Maternal Grandmother     Social History   Socioeconomic History   Marital status: Single    Spouse name: Not on file   Number of children: Not on file   Years of education: Not on file   Highest education level: Not on file  Occupational History   Not on file  Tobacco Use   Smoking status: Former    Types: Cigarettes    Quit date: 02/07/2014    Years since quitting: 8.4   Smokeless tobacco: Current    Types: Snuff  Vaping Use   Vaping Use: Some days  Substance and Sexual Activity   Alcohol use: Yes    Comment: occasional, once per week   Drug use: Not Currently    Types: Marijuana   Sexual activity: Not on file  Other Topics Concern   Not on file  Social History Narrative   Not on file   Social Determinants of Health   Financial Resource Strain: Not on file  Food Insecurity: Not on file  Transportation Needs: Not on file  Physical Activity: Not on file  Stress: Not on file  Social Connections: Not on file  Intimate Partner Violence: Not on file    ROS      Objective    There were no vitals taken for this visit.  Physical Exam  {Labs (Optional):23779}    Assessment & Plan:    Problem List Items Addressed This Visit   None   No follow-ups on file.   Adline Peals, CMA

## 2022-07-29 ENCOUNTER — Ambulatory Visit: Payer: BC Managed Care – PPO | Admitting: Cardiology

## 2022-08-14 IMAGING — CR DG CHEST 2V
1 series · 2 of 2 positions shown · non-contrast
Comparison: Chest x-ray March 16, 21.

CLINICAL DATA: Chest pain.

EXAM:
CHEST - 2 VIEW

[Series 1: dg chest 2 view · 0.14mm/px · 2 of 2 slices shown]
[im 1/2]
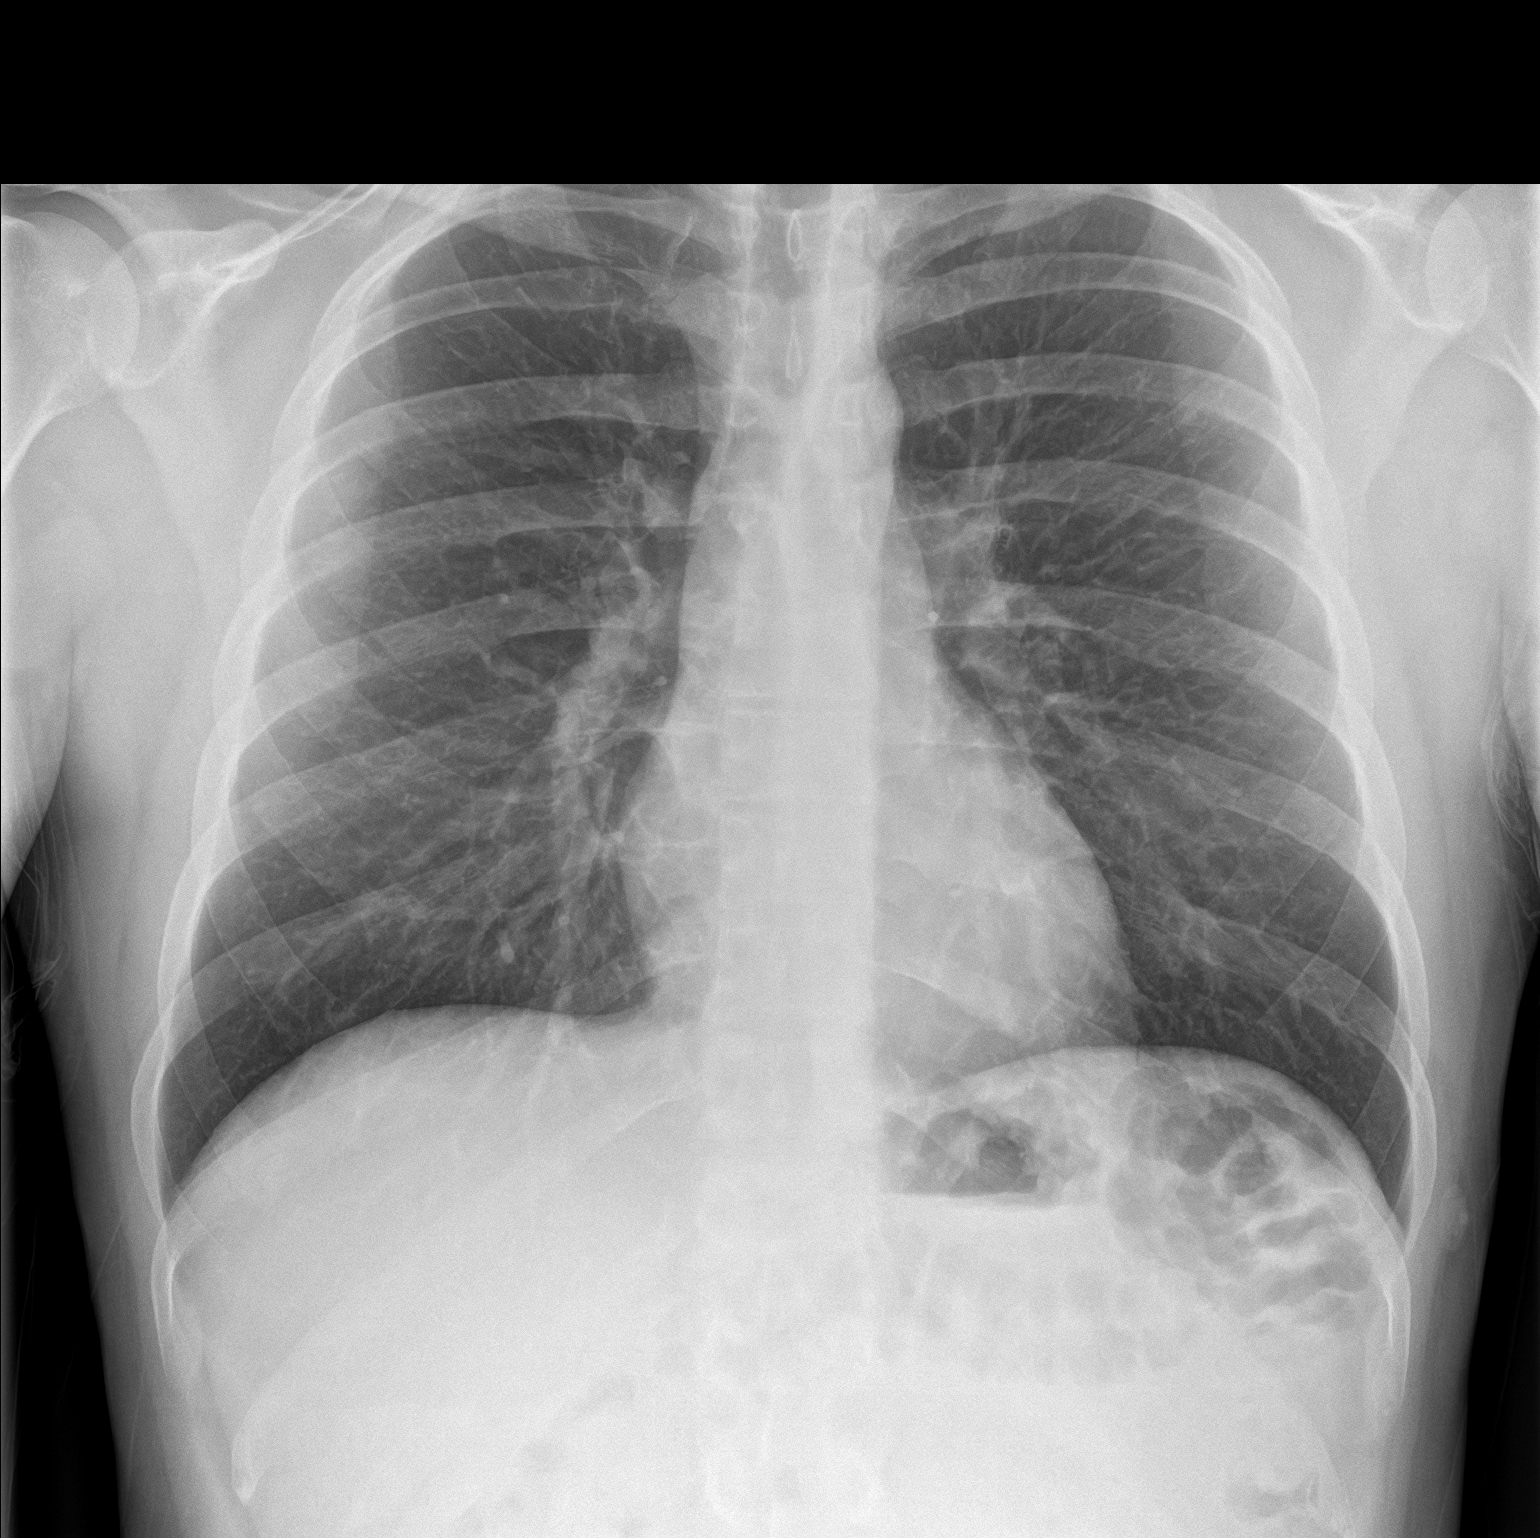
[im 2/2]
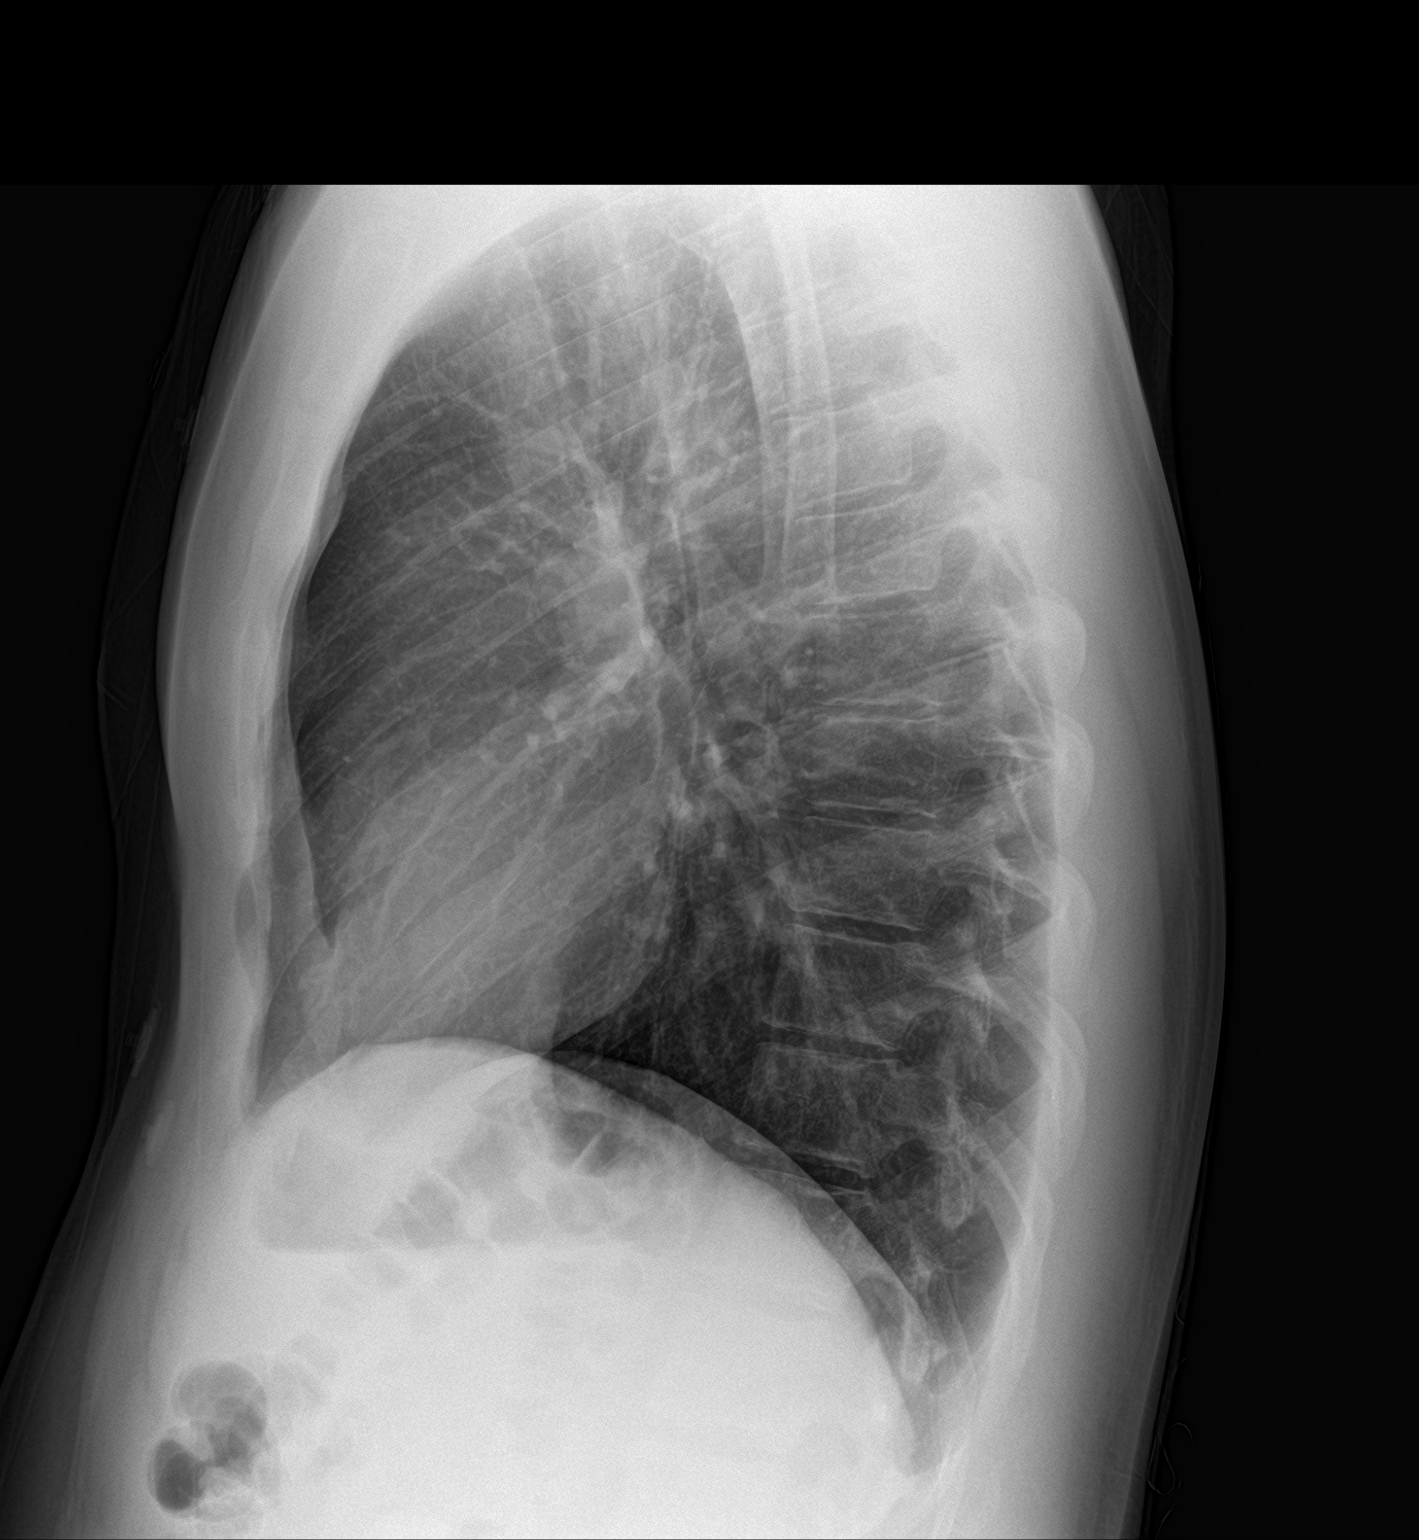

[2 of 2 positions shown; findings below may reference images not displayed]

FINDINGS: No consolidation. No visible pleural effusions or pneumothorax.
Cardiomediastinal silhouette is within normal limits. No displaced
fracture.
IMPRESSION: No evidence of acute cardiopulmonary disease.

## 2022-09-19 ENCOUNTER — Encounter: Payer: Self-pay | Admitting: Cardiology

## 2023-05-31 ENCOUNTER — Encounter: Payer: Self-pay | Admitting: General Practice

## 2023-05-31 ENCOUNTER — Ambulatory Visit (INDEPENDENT_AMBULATORY_CARE_PROVIDER_SITE_OTHER): Payer: Commercial Managed Care - PPO | Admitting: General Practice

## 2023-05-31 VITALS — BP 130/80 | HR 86 | Temp 99.1°F | Ht 70.5 in | Wt 217.0 lb

## 2023-05-31 DIAGNOSIS — Z1159 Encounter for screening for other viral diseases: Secondary | ICD-10-CM

## 2023-05-31 DIAGNOSIS — F419 Anxiety disorder, unspecified: Secondary | ICD-10-CM | POA: Insufficient documentation

## 2023-05-31 DIAGNOSIS — Z6830 Body mass index (BMI) 30.0-30.9, adult: Secondary | ICD-10-CM

## 2023-05-31 DIAGNOSIS — Z0000 Encounter for general adult medical examination without abnormal findings: Secondary | ICD-10-CM | POA: Insufficient documentation

## 2023-05-31 DIAGNOSIS — Z114 Encounter for screening for human immunodeficiency virus [HIV]: Secondary | ICD-10-CM | POA: Diagnosis not present

## 2023-05-31 DIAGNOSIS — Z789 Other specified health status: Secondary | ICD-10-CM | POA: Insufficient documentation

## 2023-05-31 DIAGNOSIS — E66811 Obesity, class 1: Secondary | ICD-10-CM | POA: Diagnosis not present

## 2023-05-31 DIAGNOSIS — E6609 Other obesity due to excess calories: Secondary | ICD-10-CM | POA: Diagnosis not present

## 2023-05-31 DIAGNOSIS — I10 Essential (primary) hypertension: Secondary | ICD-10-CM | POA: Insufficient documentation

## 2023-05-31 DIAGNOSIS — Z7689 Persons encountering health services in other specified circumstances: Secondary | ICD-10-CM | POA: Insufficient documentation

## 2023-05-31 DIAGNOSIS — Z8679 Personal history of other diseases of the circulatory system: Secondary | ICD-10-CM | POA: Insufficient documentation

## 2023-05-31 LAB — CBC
HCT: 48.1 % (ref 39.0–52.0)
Hemoglobin: 15.7 g/dL (ref 13.0–17.0)
MCHC: 32.7 g/dL (ref 30.0–36.0)
MCV: 91.5 fL (ref 78.0–100.0)
Platelets: 188 10*3/uL (ref 150.0–400.0)
RBC: 5.26 Mil/uL (ref 4.22–5.81)
RDW: 13.4 % (ref 11.5–15.5)
WBC: 5.8 10*3/uL (ref 4.0–10.5)

## 2023-05-31 LAB — COMPREHENSIVE METABOLIC PANEL
ALT: 36 U/L (ref 0–53)
AST: 21 U/L (ref 0–37)
Albumin: 4.7 g/dL (ref 3.5–5.2)
Alkaline Phosphatase: 74 U/L (ref 39–117)
BUN: 12 mg/dL (ref 6–23)
CO2: 32 meq/L (ref 19–32)
Calcium: 9.4 mg/dL (ref 8.4–10.5)
Chloride: 100 meq/L (ref 96–112)
Creatinine, Ser: 0.87 mg/dL (ref 0.40–1.50)
GFR: 119.06 mL/min (ref >60.00)
Glucose, Bld: 94 mg/dL (ref 70–99)
Potassium: 4.7 meq/L (ref 3.5–5.1)
Sodium: 139 meq/L (ref 135–145)
Total Bilirubin: 0.6 mg/dL (ref 0.2–1.2)
Total Protein: 7.5 g/dL (ref 6.0–8.3)

## 2023-05-31 LAB — LIPID PANEL
Cholesterol: 228 mg/dL — ABNORMAL HIGH (ref 0–200)
HDL: 63.3 mg/dL (ref >39.00)
LDL Cholesterol: 147 mg/dL — ABNORMAL HIGH (ref 0–99)
NonHDL: 164.35
Total CHOL/HDL Ratio: 4
Triglycerides: 86 mg/dL (ref 0.0–149.0)
VLDL: 17.2 mg/dL (ref 0.0–40.0)

## 2023-05-31 LAB — HEMOGLOBIN A1C: Hgb A1c MFr Bld: 5.4 % (ref 4.6–6.5)

## 2023-05-31 MED ORDER — PROPRANOLOL HCL 20 MG PO TABS: 20.0000 mg | ORAL_TABLET | Freq: Every day | ORAL | 0 refills | Status: DC | PRN

## 2023-05-31 NOTE — Progress Notes (Signed)
New Patient Office Visit  Subjective    Patient ID: Danny [name], male    DOB: 1995/08/23  Age: 28 y.o. MRN: [id number]  CC:  Chief Complaint  Patient presents with   New Patient (Initial Visit)   Annual Exam    HPI Danny [name] is a 28 y.o. male presents to establish care, for complete physical and follow up of chronic conditions.  Previous PCP/physical/labs: several years ago.   Immunizations: -Tetanus: Completed in 2017 -Influenza: declines  Diet: Fair diet.  Exercise: No regular exercise. Physical active all day long.  Eye exam: several years ago. Dental exam: Couple years ago.   Hypertension: was told many years ago that he had high blood pressure. He relates that to alcohol intoxication. Denies any chest pain, shortness of breath, difficulty breathing.   Alcohol abuse: drink 18 pack per week. He used to drink way more than that. Has been cutting back but then experiences withdrawals. Would like to cut back. Withdrawals include elevated bp and heart rate.   Vaping/snuff tobacco use: has cut back and hopes to quit soon.    Outpatient Encounter Medications as of 05/31/2023  Medication Sig   Multiple Vitamin (MULTIVITAMIN) capsule Take 1 capsule by mouth daily.   omega-3 acid ethyl esters (LOVAZA) 1 g capsule Take 1 g by mouth 2 (two) times daily.   propranolol (INDERAL) 20 MG tablet Take 1 tablet (20 mg total) by mouth daily as needed.   [DISCONTINUED] lisinopril (ZESTRIL) 10 MG tablet Take 10 mg by mouth daily.   No facility-administered encounter medications on file as of 05/31/2023.    Past Medical History:  Diagnosis Date   Vertigo     Past Surgical History:  Procedure Laterality Date   ABDOMINAL WALL DEFECT REPAIR      Family History  Problem Relation Age of Onset   Hypertension Mother    Hypertension Father    Hypertension Maternal Grandmother    Heart disease Maternal Grandfather     Social History   Socioeconomic History   Marital status:  Media planner    Spouse name: Danny [name]   Number of children: 2   Years of education: Not on file   Highest education level: Not on file  Occupational History   Not on file  Tobacco Use   Smoking status: Former    Current packs/day: 0.00    Types: Cigarettes    Quit date: 02/07/2014    Years since quitting: 9.3   Smokeless tobacco: Current    Types: Snuff   Tobacco comments:    occasionally  Vaping Use   Vaping status: Some Days   Substances: Nicotine  Substance and Sexual Activity   Alcohol use: Yes    Alcohol/week: 18.0 standard drinks of alcohol    Types: 18 Cans of beer per week   Drug use: Not Currently    Types: Marijuana    Comment: years ago   Sexual activity: Yes    Partners: Female    Birth control/protection: None    Comment: partner has IUD  Other Topics Concern   Not on file  Social History Narrative   Not on file   Social Drivers of Health   Financial Resource Strain: Not on file  Food Insecurity: Not on file  Transportation Needs: Not on file  Physical Activity: Not on file  Stress: Not on file  Social Connections: Not on file  Intimate Partner Violence: Not on file    Review of Systems  Constitutional:  Negative for chills, fever, malaise/fatigue and weight loss.  HENT:  Negative for congestion, ear discharge, ear pain, hearing loss, nosebleeds, sinus pain, sore throat and tinnitus.   Eyes:  Negative for blurred vision, double vision, pain, discharge and redness.  Respiratory:  Negative for cough, shortness of breath, wheezing and stridor.   Cardiovascular:  Negative for chest pain, palpitations and leg swelling.  Gastrointestinal:  Negative for abdominal pain, constipation, diarrhea, heartburn, nausea and vomiting.  Genitourinary:  Negative for dysuria, frequency and urgency.  Musculoskeletal:  Negative for myalgias.  Skin:  Negative for rash.  Neurological:  Negative for dizziness, tingling, seizures, weakness and headaches.   Psychiatric/Behavioral:  Negative for depression, substance abuse and suicidal ideas. The patient is not nervous/anxious.         Objective    BP 130/80 (BP Location: Left Arm, Patient Position: Sitting, Cuff Size: Normal)   Pulse 86   Temp 99.1 F (37.3 C) (Oral)   Ht 5' 10.5" (1.791 m)   Wt 217 lb (98.4 kg)   SpO2 96%   BMI 30.70 kg/m   Physical Exam Vitals and nursing note reviewed.  Constitutional:      Appearance: Normal appearance.  HENT:     Head: Normocephalic and atraumatic.     Right Ear: Tympanic membrane, ear canal and external ear normal.     Left Ear: Tympanic membrane, ear canal and external ear normal.     Nose: Nose normal.     Mouth/Throat:     Mouth: Mucous membranes are moist.     Pharynx: Oropharynx is clear.  Eyes:     Conjunctiva/sclera: Conjunctivae normal.     Pupils: Pupils are equal, round, and reactive to light.  Cardiovascular:     Rate and Rhythm: Normal rate and regular rhythm.     Pulses: Normal pulses.     Heart sounds: Normal heart sounds.  Pulmonary:     Effort: Pulmonary effort is normal.     Breath sounds: Normal breath sounds.  Abdominal:     General: Abdomen is flat. Bowel sounds are normal.     Palpations: Abdomen is soft.  Musculoskeletal:        General: Normal range of motion.     Cervical back: Normal range of motion.  Skin:    General: Skin is warm and dry.     Capillary Refill: Capillary refill takes less than 2 seconds.  Neurological:     General: No focal deficit present.     Mental Status: He is alert and oriented to person, place, and time. Mental status is at baseline.  Psychiatric:        Mood and Affect: Mood normal.        Behavior: Behavior normal.        Thought Content: Thought content normal.        Judgment: Judgment normal.         Assessment & Plan:  Class 1 obesity due to excess calories with body mass index (BMI) of 30.0 to 30.9 in adult, unspecified whether serious comorbidity  present Assessment & Plan: Discussed the importance of eating healthy and exercise.   Labs pending.  Orders: -     CBC -     Lipid panel -     Hemoglobin A1c -     Comprehensive metabolic panel  Screening for HIV (human immunodeficiency virus) -     HIV Antibody (routine testing w rflx)  Need for hepatitis C screening test -  Hepatitis C antibody  Encounter for screening and preventative care Assessment & Plan: Immunizations tetanus UTD. Declines influenza. HPV unsure.   Discussed the importance of a healthy diet and regular exercise in order for weight loss, and to reduce the risk of further co-morbidity.  Exam stable. Labs pending.  Follow up in 1 year for repeat physical.   Orders: -     CBC -     Lipid panel -     HIV Antibody (routine testing w rflx) -     Hepatitis C antibody -     Hemoglobin A1c -     Comprehensive metabolic panel  History of hypertension Assessment & Plan: BP slightly elevated. Relates it to being nervous.   BP log given to check BP at home.   Follow up in 4 weeks.   Encounter to establish care with new doctor Assessment & Plan: EMR reviewed briefly.   Anxiety Assessment & Plan:    05/31/2023   10:43 AM  GAD 7 : Generalized Anxiety Score  Nervous, Anxious, on Edge 1  Control/stop worrying 2  Worry too much - different things 1  Trouble relaxing 1  Restless 0  Easily annoyed or irritable 0  Afraid - awful might happen 1  Total GAD 7 Score 6  Anxiety Difficulty Somewhat difficult   Uncontrolled at times.   Discussed symptoms and denies SI/HI.   Start Propanolol 20 mg once daily as needed. Discussed side effects.   Follow up in 4 weeks.  Orders: -     Propranolol HCl; Take 1 tablet (20 mg total) by mouth daily as needed.  Dispense: 30 tablet; Refill: 0  Alcohol use Assessment & Plan: Discussed cutting back and cessation.   Will check liver functions today.   Start propanolol as needed to help with elevated BP  and HR as he is going to start cutting back alcohol.   Follow up in 4 weeks.      Return in about 4 weeks (around 06/28/2023) for alcohol.   Modesto Charon, NP

## 2023-05-31 NOTE — Assessment & Plan Note (Signed)
BP slightly elevated. Relates it to being nervous.   BP log given to check BP at home.   Follow up in 4 weeks.

## 2023-05-31 NOTE — Patient Instructions (Addendum)
Stop by the lab prior to leaving today. I will notify you of your results once received.   Let me know if you would like the HPV vaccine.   Cut back on alcohol. Follow up with me in 4 weeks to discuss your progress.   Start Propanolol 20 mg once daily as needed.   It was a pleasure meeting you!

## 2023-05-31 NOTE — Assessment & Plan Note (Addendum)
    05/31/2023   10:43 AM  GAD 7 : Generalized Anxiety Score  Nervous, Anxious, on Edge 1  Control/stop worrying 2  Worry too much - different things 1  Trouble relaxing 1  Restless 0  Easily annoyed or irritable 0  Afraid - awful might happen 1  Total GAD 7 Score 6  Anxiety Difficulty Somewhat difficult   Uncontrolled at times.   Discussed symptoms and denies SI/HI.   Start Propanolol 20 mg once daily as needed. Discussed side effects.   Follow up in 4 weeks.

## 2023-05-31 NOTE — Assessment & Plan Note (Signed)
Immunizations tetanus UTD. Declines influenza. HPV unsure.   Discussed the importance of a healthy diet and regular exercise in order for weight loss, and to reduce the risk of further co-morbidity.  Exam stable. Labs pending.  Follow up in 1 year for repeat physical.

## 2023-05-31 NOTE — Assessment & Plan Note (Signed)
Discussed the importance of eating healthy and exercise.   Labs pending.

## 2023-05-31 NOTE — Assessment & Plan Note (Signed)
EMR reviewed briefly.

## 2023-05-31 NOTE — Assessment & Plan Note (Signed)
Discussed cutting back and cessation.   Will check liver functions today.   Start propanolol as needed to help with elevated BP and HR as he is going to start cutting back alcohol.   Follow up in 4 weeks.

## 2023-06-01 LAB — HEPATITIS C ANTIBODY: Hepatitis C Ab: NONREACTIVE

## 2023-06-16 ENCOUNTER — Emergency Department (HOSPITAL_BASED_OUTPATIENT_CLINIC_OR_DEPARTMENT_OTHER): Payer: Commercial Managed Care - PPO

## 2023-06-16 ENCOUNTER — Emergency Department (HOSPITAL_BASED_OUTPATIENT_CLINIC_OR_DEPARTMENT_OTHER)
Admission: EM | Admit: 2023-06-16 | Discharge: 2023-06-16 | Disposition: A | Discharge status: Home / Self Care | Payer: Commercial Managed Care - PPO | Attending: Emergency Medicine | Admitting: Emergency Medicine

## 2023-06-16 ENCOUNTER — Encounter (HOSPITAL_BASED_OUTPATIENT_CLINIC_OR_DEPARTMENT_OTHER): Payer: Self-pay

## 2023-06-16 ENCOUNTER — Other Ambulatory Visit: Payer: Self-pay | Admitting: General Practice

## 2023-06-16 ENCOUNTER — Other Ambulatory Visit: Payer: Self-pay

## 2023-06-16 DIAGNOSIS — F419 Anxiety disorder, unspecified: Secondary | ICD-10-CM

## 2023-06-16 DIAGNOSIS — R079 Chest pain, unspecified: Secondary | ICD-10-CM | POA: Insufficient documentation

## 2023-06-16 DIAGNOSIS — I10 Essential (primary) hypertension: Secondary | ICD-10-CM | POA: Diagnosis not present

## 2023-06-16 LAB — CBC
HCT: 47.2 % (ref 39.0–52.0)
Hemoglobin: 15.5 g/dL (ref 13.0–17.0)
MCH: 29.3 pg (ref 26.0–34.0)
MCHC: 32.8 g/dL (ref 30.0–36.0)
MCV: 89.2 fL (ref 80.0–100.0)
Platelets: 217 10*3/uL (ref 150–400)
RBC: 5.29 MIL/uL (ref 4.22–5.81)
RDW: 12.6 % (ref 11.5–15.5)
WBC: 7.1 10*3/uL (ref 4.0–10.5)
nRBC: 0 % (ref 0.0–0.2)

## 2023-06-16 LAB — BASIC METABOLIC PANEL
Anion gap: 9 (ref 5–15)
BUN: 12 mg/dL (ref 6–20)
CO2: 28 mmol/L (ref 22–32)
Calcium: 9.4 mg/dL (ref 8.9–10.3)
Chloride: 100 mmol/L (ref 98–111)
Creatinine, Ser: 0.86 mg/dL (ref 0.61–1.24)
GFR, Estimated: >60 mL/min (ref >60)
Glucose, Bld: 94 mg/dL (ref 70–99)
Potassium: 3.9 mmol/L (ref 3.5–5.1)
Sodium: 137 mmol/L (ref 135–145)

## 2023-06-16 LAB — TROPONIN I (HIGH SENSITIVITY)
Troponin I (High Sensitivity): 2 ng/L (ref <18)
Troponin I (High Sensitivity): 2 ng/L (ref <18)

## 2023-06-16 MED ORDER — PROPRANOLOL HCL 20 MG PO TABS: 20.0000 mg | ORAL_TABLET | Freq: Every day | ORAL | 0 refills | Status: DC | PRN

## 2023-06-16 NOTE — ED Provider Notes (Signed)
 [place] [facility] Provider Note   CSN: [id number] Arrival date & time: 06/16/23  1026     History  Chief Complaint  Patient presents with   Chest Pain    Danny [name] is a 28 y.o. male with past medical history of obesity, hypertension, alcohol use presenting to emergency room with right-sided chest pain that started a few hours ago.  Patient reports that he was at work walking around when he started noticing a sharp pain in right upper chest.  Pain is intermittent and he has no pain when he is not moving.  He reports it is worse when he is moving like bending side-to-side or when he is up walking around.  It is not associated with any shortness of breath, lightheadedness, palpitations, diaphoresis, cough, extremity swelling.  He has never had anything like this in the past.  He has no known history of heart disease.  Denies any unilateral Swelling or pain. Denies recent injury or fall. Denies recent illness.    Chest Pain      Home Medications Prior to Admission medications   Medication Sig Start Date End Date Taking? Authorizing Provider  Multiple Vitamin (MULTIVITAMIN) capsule Take 1 capsule by mouth daily.    [provider]  omega-3 acid ethyl esters (LOVAZA) 1 g capsule Take 1 g by mouth 2 (two) times daily.    [provider]  propranolol (INDERAL) 20 MG tablet Take 1 tablet (20 mg total) by mouth daily as needed. 05/31/23   Modesto Charon, NP      Allergies    Patient has no known allergies.    Review of Systems   Review of Systems  Cardiovascular:  Positive for chest pain.    Physical Exam Updated Vital Signs BP (!) 144/97   Pulse 65   Temp 98.4 F (36.9 C) (Oral)   Resp 20   Ht 5' 10.5" (1.791 m)   Wt 95.3 kg   SpO2 99%   BMI 29.71 kg/m  Physical Exam Vitals and nursing note reviewed.  Constitutional:      General: He is not in acute distress.    Appearance: He is not toxic-appearing.  HENT:      Head: Normocephalic and atraumatic.  Eyes:     General: No scleral icterus.    Conjunctiva/sclera: Conjunctivae normal.  Cardiovascular:     Rate and Rhythm: Normal rate and regular rhythm.     Pulses: Normal pulses.     Heart sounds: Normal heart sounds.  Pulmonary:     Effort: Pulmonary effort is normal. No respiratory distress.     Breath sounds: Normal breath sounds.  Chest:     Chest wall: Tenderness present.  Abdominal:     General: Abdomen is flat. Bowel sounds are normal.     Palpations: Abdomen is soft.     Tenderness: There is no abdominal tenderness.  Musculoskeletal:     Right lower leg: No edema.     Left lower leg: No edema.  Skin:    General: Skin is warm and dry.     Findings: No lesion.  Neurological:     General: No focal deficit present.     Mental Status: He is alert and oriented to person, place, and time. Mental status is at baseline.     ED Results / Procedures / Treatments   Labs (all labs ordered are listed, but only abnormal results are displayed) Labs Reviewed  BASIC METABOLIC PANEL  CBC  TROPONIN I (HIGH SENSITIVITY)  TROPONIN I (HIGH SENSITIVITY)    EKG EKG Interpretation Date/Time:  Friday June 16 2023 10:37:46 EST Ventricular Rate:  62 PR Interval:  163 QRS Duration:  101 QT Interval:  388 QTC Calculation: 394 R Axis:   -35  Text Interpretation: Sinus rhythm Left axis deviation Confirmed by Linwood Dibbles [phone]) on 06/16/2023 10:39:43 AM  Radiology No results found.  Procedures Procedures    Medications Ordered in ED Medications - No data to display  ED Course/ Medical Decision Making/ A&P                                 Medical Decision Making Amount and/or Complexity of Data Reviewed Labs: ordered. Radiology: ordered.   Flonnie [name] 27 y.o. presented today for chest pain. Working DDx that I considered at this time includes, but not limited to, ACS, GERD, pe, pna, aortic dissection, pneumothorax, MSK path, anemia,  esophageal rupture, CHF exacerbation, valvular disorder, myocarditis, pericarditis, endocarditis, pericardial effusion/cardiac tamponade, pulmonary edema, gastritis/PUD, esophagitis.   Review of prior external notes: 2/25 OV  Unique Tests and My Interpretation:  EKG: Rate, rhythm, axis, intervals all examined: sinus  CBC without leukocytosis and no anemia.  BMP is unremarkable with no electrolyte abnormality.  Troponin 2 will obtain delta troponin given duration of symptoms.  PERC negative   Problem List / ED Course / Critical interventions / Medication management   Patient presenting with chest pain. Chest pain is worse with taking deep breath in and with movement. He does have Low HEART score and atypical chest pain presentation.  Doubt CHF as cause of chest discomfort as he does not have any sign of extremity edema or fluid overload on exam.  He is PERC negative and no sign of DVT thus doubt PE as cause.  Chest x-ray without any obvious pneumonia or pneumothorax.  He is hemodynamically stable and well-appearing.  Plan is to treat troponin to evaluate for ACS.  EKG does show sinus rhythm. Also checking basic labs.  Patient reports since arriving to emergency room he is not having any episodes of chest pain.  He would not like to try anything for his chest pain.  Given 2 negative troponins and reassuring EKG feel he is stable for discharge.  Will have him follow-up with primary care as this is atypical chest pain presentation. Patient hemodynamically stable throughout stay, appropriate for discharge.   Patients vitals assessed. Upon arrival patient is hemodynamically stable.  I have reviewed the patients home medicines and have made adjustments as needed    Plan:  F/u w/ PCP to ensure resolution of sx.  Patient was given return precautions. Patient stable for discharge at this time.  Patient educated on sx/ dx and verbalized understanding of plan.  Will return to ER w/ new or worsening sx.           Final Clinical Impression(s) / ED Diagnoses Final diagnoses:  Chest pain, unspecified type    Rx / DC Orders ED Discharge Orders     None         Smitty Knudsen, PA-C 06/16/23 1343    Linwood Dibbles, MD 06/17/23 1345

## 2023-06-16 NOTE — ED Triage Notes (Signed)
 Patient arrives POV with complaints of intermittent right side chest pains that started today. Rates his pain a 2/10.

## 2023-06-16 NOTE — Discharge Instructions (Addendum)
 I recommend taking anti-inflammatory medication like ibuprofen or naproxen.  You can also take Tylenol. Please follow-up with primary care to discuss symptoms and for further evaluation. Return to the emergency room if you have any new or worsening symptoms.

## 2023-06-19 ENCOUNTER — Encounter: Payer: Self-pay | Admitting: General Practice

## 2023-06-19 ENCOUNTER — Ambulatory Visit: Payer: Commercial Managed Care - PPO | Admitting: General Practice

## 2023-06-19 VITALS — BP 122/74 | HR 63 | Temp 98.6°F | Wt 213.0 lb

## 2023-06-19 DIAGNOSIS — F419 Anxiety disorder, unspecified: Secondary | ICD-10-CM | POA: Diagnosis not present

## 2023-06-19 DIAGNOSIS — I10 Essential (primary) hypertension: Secondary | ICD-10-CM

## 2023-06-19 DIAGNOSIS — R0789 Other chest pain: Secondary | ICD-10-CM | POA: Diagnosis not present

## 2023-06-19 MED ORDER — HYDROCHLOROTHIAZIDE 25 MG PO TABS: 25.0000 mg | ORAL_TABLET | Freq: Every day | ORAL | 0 refills | Status: DC

## 2023-06-19 NOTE — Assessment & Plan Note (Signed)
 Slightly improved.  Discussed using Propanolol as needed only.   Declines referral for therapy.

## 2023-06-19 NOTE — Patient Instructions (Signed)
 Start hydrochlorothiazide 25 mg once daily. Take it in the morning as it will make you urinate more.   Please continue to monitor BP at home and report readings consistently above 140/90 and below 90/60.  Continue Propanolol 20 mg once as needed for Anxiety.   Continue to monitor diet and eat a low cholesterol low fat diet.   Increase exercise.   Continue to decrease the alcohol intake. You are almost there!! :)  Follow up in two weeks.

## 2023-06-19 NOTE — Progress Notes (Signed)
 Established Patient Office Visit  Subjective   Patient ID: Danny [name], male    DOB: 11-Mar-1996  Age: 28 y.o. MRN: [id number]  Chief Complaint  Patient presents with   Anxiety   Hypertension    States BP has still been up    Anxiety Symptoms include nervous/anxious behavior. Patient reports no chest pain, dizziness, nausea, shortness of breath or suicidal ideas.    Hypertension Associated symptoms include anxiety. Pertinent negatives include no chest pain, headaches or shortness of breath.   Danny [name] is a 28 year old male with past medical history of HTN, anxiety, alcohol abuse presents today for a follow up.   He was evaluated on 05/31/23 and was started on Propanolol for anxiety. He sent a mychart message on 06/15/23 stating that he has been taking propanolol 2 times a day to help with BP and anxiety as it was not helping his anxiety. Patient was asked to follow up in the office on today.   On 06/16/23, he went to ER at [facility] for chest pain. EKG, Troponin levels were all within normal limits. He was asked to follow up with PCP. He denies any more episodes. It was found to be MSK related.   His home readings range from 120s-140s/80s-90s. He denies blurred vision, headaches, chest pain or shortness of breath.    Patient Active Problem List   Diagnosis Date Noted   Atypical chest pain 06/19/2023   Class 1 obesity due to excess calories with body mass index (BMI) of 30.0 to 30.9 in adult 05/31/2023   Encounter for screening and preventative care 05/31/2023   Hypertension 05/31/2023   Anxiety 05/31/2023   Alcohol use 05/31/2023   Past Medical History:  Diagnosis Date   Allergy    GERD (gastroesophageal reflux disease)    Hypertension    Vertigo    No Known Allergies       06/19/2023    3:02 PM 05/31/2023   10:43 AM  Depression screen PHQ 2/9  Decreased Interest 0 0  Down, Depressed, Hopeless 0 0  PHQ - 2 Score 0 0  Altered sleeping 1 2  Tired,  decreased energy 1 1  Change in appetite 1 1  Feeling bad or failure about yourself  0 1  Trouble concentrating 1 0  Moving slowly or fidgety/restless 0 1  Suicidal thoughts 0 0  PHQ-9 Score 4 6  Difficult doing work/chores Not difficult at all Not difficult at all       06/19/2023    3:02 PM 05/31/2023   10:43 AM  GAD 7 : Generalized Anxiety Score  Nervous, Anxious, on Edge 2 1  Control/stop worrying 1 2  Worry too much - different things 1 1  Trouble relaxing 2 1  Restless 1 0  Easily annoyed or irritable 0 0  Afraid - awful might happen 1 1  Total GAD 7 Score 8 6  Anxiety Difficulty Somewhat difficult Somewhat difficult      Review of Systems  Constitutional:  Negative for chills and fever.  Respiratory:  Negative for shortness of breath.   Cardiovascular:  Negative for chest pain.  Gastrointestinal:  Negative for abdominal pain, constipation, diarrhea, heartburn, nausea and vomiting.  Genitourinary:  Negative for dysuria, frequency and urgency.  Neurological:  Negative for dizziness and headaches.  Endo/Heme/Allergies:  Negative for polydipsia.  Psychiatric/Behavioral:  Negative for depression and suicidal ideas. The patient is nervous/anxious.       Objective:  BP 122/74 (BP Location: Left Arm, Patient Position: Sitting, Cuff Size: Normal)   Pulse 63   Temp 98.6 F (37 C) (Oral)   Wt 213 lb (96.6 kg)   SpO2 98%   BMI 30.13 kg/m  BP Readings from Last 3 Encounters:  06/19/23 122/74  06/16/23 125/83  05/31/23 130/80   Wt Readings from Last 3 Encounters:  06/19/23 213 lb (96.6 kg)  06/16/23 210 lb (95.3 kg)  05/31/23 217 lb (98.4 kg)      Physical Exam Vitals and nursing note reviewed.  Constitutional:      Appearance: Normal appearance.  Cardiovascular:     Rate and Rhythm: Normal rate and regular rhythm.     Pulses: Normal pulses.     Heart sounds: Normal heart sounds.  Pulmonary:     Effort: Pulmonary effort is normal.     Breath sounds:  Normal breath sounds.  Neurological:     Mental Status: He is alert and oriented to person, place, and time.  Psychiatric:        Mood and Affect: Mood normal.        Behavior: Behavior normal.        Thought Content: Thought content normal.        Judgment: Judgment normal.      No results found for any visits on 06/19/23.     The ASCVD Risk score (Arnett DK, et al., 2019) failed to calculate for the following reasons:   The 2019 ASCVD risk score is only valid for ages 36 to 53    Assessment & Plan:  Primary hypertension Assessment & Plan: Uncontrolled.  BP at goal today.   Considering he has had multiple readings in the past that are elevated, home readings continue to be elevated. He contributes his anxiety is due to his high BP.   Discussed antihypertensives. Patient agreeable.   Start hydrochlorothiazide 25 mg once daily. Discussed side effects. Verbalized understanding.  Discussed monitoring at home and parameters given.   Follow up in 2 weeks.  Orders: -     hydroCHLOROthiazide; Take 1 tablet (25 mg total) by mouth daily.  Dispense: 90 tablet; Refill: 0  Atypical chest pain Assessment & Plan: Exam stable today.   Denies any chest pain or shortness of breath since the day of his ER visit.   Reviewed notes, EKG, labs from 06/16/23.   He will update if his symptoms returns.  Consider cardiology referral if needed.   Anxiety Assessment & Plan: Slightly improved.  Discussed using Propanolol as needed only.   Declines referral for therapy.      Return in about 2 weeks (around 07/03/2023) for BP.    Modesto Charon, NP

## 2023-06-19 NOTE — Assessment & Plan Note (Signed)
 Uncontrolled.  BP at goal today.   Considering he has had multiple readings in the past that are elevated, home readings continue to be elevated. He contributes his anxiety is due to his high BP.   Discussed antihypertensives. Patient agreeable.   Start hydrochlorothiazide 25 mg once daily. Discussed side effects. Verbalized understanding.  Discussed monitoring at home and parameters given.   Follow up in 2 weeks.

## 2023-06-19 NOTE — Assessment & Plan Note (Signed)
 Exam stable today.   Denies any chest pain or shortness of breath since the day of his ER visit.   Reviewed notes, EKG, labs from 06/16/23.   He will update if his symptoms returns.  Consider cardiology referral if needed.

## 2023-07-04 ENCOUNTER — Encounter: Payer: Self-pay | Admitting: General Practice

## 2023-07-04 ENCOUNTER — Ambulatory Visit: Payer: Commercial Managed Care - PPO | Admitting: General Practice

## 2023-07-04 DIAGNOSIS — I10 Essential (primary) hypertension: Secondary | ICD-10-CM

## 2023-07-04 DIAGNOSIS — F419 Anxiety disorder, unspecified: Secondary | ICD-10-CM

## 2023-07-13 ENCOUNTER — Other Ambulatory Visit: Payer: Self-pay | Admitting: General Practice

## 2023-07-13 DIAGNOSIS — F419 Anxiety disorder, unspecified: Secondary | ICD-10-CM

## 2023-07-18 MED ORDER — PROPRANOLOL HCL 20 MG PO TABS: 20.0000 mg | ORAL_TABLET | Freq: Every day | ORAL | 0 refills | Status: DC | PRN

## 2023-08-14 ENCOUNTER — Other Ambulatory Visit: Payer: Self-pay | Admitting: General Practice

## 2023-08-14 DIAGNOSIS — F419 Anxiety disorder, unspecified: Secondary | ICD-10-CM

## 2023-08-22 ENCOUNTER — Ambulatory Visit: Admitting: General Practice

## 2023-08-22 ENCOUNTER — Encounter: Payer: Self-pay | Admitting: General Practice

## 2023-08-22 VITALS — BP 126/82 | HR 70 | Temp 97.8°F | Ht 70.25 in | Wt 209.0 lb

## 2023-08-22 DIAGNOSIS — F1090 Alcohol use, unspecified, uncomplicated: Secondary | ICD-10-CM | POA: Diagnosis not present

## 2023-08-22 DIAGNOSIS — I10 Essential (primary) hypertension: Secondary | ICD-10-CM | POA: Diagnosis not present

## 2023-08-22 DIAGNOSIS — F419 Anxiety disorder, unspecified: Secondary | ICD-10-CM | POA: Diagnosis not present

## 2023-08-22 MED ORDER — LOSARTAN POTASSIUM 50 MG PO TABS: 50.0000 mg | ORAL_TABLET | Freq: Every day | ORAL | 0 refills | Status: DC

## 2023-08-22 MED ORDER — PROPRANOLOL HCL 20 MG PO TABS: 20.0000 mg | ORAL_TABLET | Freq: Every day | ORAL | 0 refills | Status: DC | PRN

## 2023-08-22 MED ORDER — HYDROCHLOROTHIAZIDE 25 MG PO TABS: 25.0000 mg | ORAL_TABLET | Freq: Every day | ORAL | 0 refills | Status: DC

## 2023-08-22 NOTE — Progress Notes (Signed)
 Established Patient Office Visit  Subjective   Patient ID: Danny [name], male    DOB: [birth year]  Age: 28 y.o. MRN: [id number]  Chief Complaint  Patient presents with   Hypertension    Patient here today to follow up on BP.     HPI  Danny [name] is a 28 year old male with history of HTN, anxiety, alcohol use and atypical chest pain presents today for follow-up on hypertension.  HTN: He was evaluated on 06/19/2023 and was started on hydrochlorothiazide  25 mg.  He was asked to follow-up in 2 weeks however he has not followed up since then.  He has not been checking his blood pressure at home.  However he reports that when he wakes up he can tell his blood pressure is elevated and his heart is racing at times.  This happens a few times a week.  He denies any blurred vision, headache, unilateral weakness, chest pain or shortness of breath.  . Patient Active Problem List   Diagnosis Date Noted   Atypical chest pain 06/19/2023   Class 1 obesity due to excess calories with body mass index (BMI) of 30.0 to 30.9 in adult 05/31/2023   Encounter for screening and preventative care 05/31/2023   Hypertension 05/31/2023   Anxiety 05/31/2023   Alcohol use 05/31/2023   Past Medical History:  Diagnosis Date   Allergy    GERD (gastroesophageal reflux disease)    Hypertension    Vertigo    Past Surgical History:  Procedure Laterality Date   ABDOMINAL WALL DEFECT REPAIR     No Known Allergies       08/22/2023   12:52 PM 06/19/2023    3:02 PM 05/31/2023   10:43 AM  Depression screen PHQ 2/9  Decreased Interest 0 0 0  Down, Depressed, Hopeless 0 0 0  PHQ - 2 Score 0 0 0  Altered sleeping 2 1 2   Tired, decreased energy 1 1 1   Change in appetite 0 1 1  Feeling bad or failure about yourself  0 0 1  Trouble concentrating 1 1 0  Moving slowly or fidgety/restless 2 0 1  Suicidal thoughts 0 0 0  PHQ-9 Score 6 4 6   Difficult doing work/chores Not difficult at all Not difficult at all Not  difficult at all       08/22/2023   12:52 PM 06/19/2023    3:02 PM 05/31/2023   10:43 AM  GAD 7 : Generalized Anxiety Score  Nervous, Anxious, on Edge 1 2 1   Control/stop worrying 1 1 2   Worry too much - different things 1 1 1   Trouble relaxing 1 2 1   Restless 1 1 0  Easily annoyed or irritable 0 0 0  Afraid - awful might happen 0 1 1  Total GAD 7 Score 5 8 6   Anxiety Difficulty Not difficult at all Somewhat difficult Somewhat difficult      Review of Systems  Constitutional:  Negative for chills and fever.  Respiratory:  Negative for shortness of breath.   Cardiovascular:  Negative for chest pain and leg swelling.  Gastrointestinal:  Negative for abdominal pain, constipation, diarrhea, heartburn, nausea and vomiting.  Genitourinary:  Negative for dysuria, frequency and urgency.  Neurological:  Negative for dizziness and headaches.  Endo/Heme/Allergies:  Negative for polydipsia.  Psychiatric/Behavioral:  Negative for depression and suicidal ideas. The patient is not nervous/anxious.       Objective:     BP 126/82 (BP Location: Left Arm,  Patient Position: Sitting, Cuff Size: Normal)   Pulse 70   Temp 97.8 F (36.6 C) (Oral)   Ht 5' 10.25" (1.784 m)   Wt 209 lb (94.8 kg)   SpO2 99%   BMI 29.78 kg/m  BP Readings from Last 3 Encounters:  08/22/23 126/82  06/19/23 122/74  06/16/23 125/83   Wt Readings from Last 3 Encounters:  08/22/23 209 lb (94.8 kg)  06/19/23 213 lb (96.6 kg)  06/16/23 210 lb (95.3 kg)      Physical Exam Vitals and nursing note reviewed.  Constitutional:      Appearance: Normal appearance.  Cardiovascular:     Rate and Rhythm: Normal rate and regular rhythm.     Pulses: Normal pulses.     Heart sounds: Normal heart sounds.  Pulmonary:     Effort: Pulmonary effort is normal.     Breath sounds: Normal breath sounds.  Musculoskeletal:        General: No swelling.  Neurological:     Mental Status: He is alert and oriented to person, place,  and time.  Psychiatric:        Mood and Affect: Mood normal.        Behavior: Behavior normal.        Thought Content: Thought content normal.        Judgment: Judgment normal.      No results found for any visits on 08/22/23.     The ASCVD Risk score (Arnett DK, et al., 2019) failed to calculate for the following reasons:   The 2019 ASCVD risk score is only valid for ages 22 to 62    Assessment & Plan:  Primary hypertension Assessment & Plan: Blood pressure better with the second reading today. BP goal less than 130/90. Given his symptoms at home, we will add losartan 50 mg once daily. Discussed medication adherence at appointment adherence with patient. He will send his blood pressure readings to me in 1 week via MyChart. Consider increasing losartan if he continues to have readings above goal.  Will check BMP at next visit. Follow-up in office in 2 weeks.  Orders: -     hydroCHLOROthiazide ; Take 1 tablet (25 mg total) by mouth daily.  Dispense: 90 tablet; Refill: 0 -     Losartan Potassium; Take 1 tablet (50 mg total) by mouth daily.  Dispense: 30 tablet; Refill: 0  Anxiety Assessment & Plan: Controlled. Continue propranolol  20 mg as needed. Refill sent to the pharmacy.  Orders: -     Propranolol  HCl; Take 1 tablet (20 mg total) by mouth daily as needed.  Dispense: 30 tablet; Refill: 0  Alcohol use Assessment & Plan: Discussed cessation and the risks of alcohol abuse with patient. Verbalizes understanding.      Return in about 2 weeks (around 09/05/2023) for HTN.    Jolanda Nation, NP

## 2023-08-22 NOTE — Assessment & Plan Note (Signed)
 Blood pressure better with the second reading today. BP goal less than 130/90. Given his symptoms at home, we will add losartan 50 mg once daily. Discussed medication adherence at appointment adherence with patient. He will send his blood pressure readings to me in 1 week via MyChart. Consider increasing losartan if he continues to have readings above goal.  Will check BMP at next visit. Follow-up in office in 2 weeks.

## 2023-08-22 NOTE — Assessment & Plan Note (Signed)
 Controlled. Continue propranolol  20 mg as needed. Refill sent to the pharmacy.

## 2023-08-22 NOTE — Patient Instructions (Signed)
 Start Losartan 50 mg once daily along with the hydrochlorothiazide  25 mg once daily.   Monitor BP for two weeks.   Continue propanolol as needed for anxiety.   Follow up in 2 weeks.

## 2023-08-22 NOTE — Assessment & Plan Note (Signed)
 Discussed cessation and the risks of alcohol abuse with patient. Verbalizes understanding.

## 2023-09-05 ENCOUNTER — Encounter: Payer: Self-pay | Admitting: General Practice

## 2023-09-05 ENCOUNTER — Ambulatory Visit: Payer: Self-pay | Admitting: General Practice

## 2023-09-05 ENCOUNTER — Ambulatory Visit: Admitting: General Practice

## 2023-09-05 VITALS — BP 112/82 | HR 65 | Temp 97.9°F | Ht 70.25 in | Wt 209.0 lb

## 2023-09-05 DIAGNOSIS — I10 Essential (primary) hypertension: Secondary | ICD-10-CM | POA: Diagnosis not present

## 2023-09-05 DIAGNOSIS — Z114 Encounter for screening for human immunodeficiency virus [HIV]: Secondary | ICD-10-CM

## 2023-09-05 LAB — BASIC METABOLIC PANEL WITH GFR
BUN: 11 mg/dL (ref 6–23)
CO2: 29 meq/L (ref 19–32)
Calcium: 8.9 mg/dL (ref 8.4–10.5)
Chloride: 100 meq/L (ref 96–112)
Creatinine, Ser: 0.83 mg/dL (ref 0.40–1.50)
GFR: 120.54 mL/min (ref >60.00)
Glucose, Bld: 94 mg/dL (ref 70–99)
Potassium: 3.7 meq/L (ref 3.5–5.1)
Sodium: 138 meq/L (ref 135–145)

## 2023-09-05 LAB — TESTOSTERONE: Testosterone: 496.99 ng/dL (ref 300.00–890.00)

## 2023-09-05 MED ORDER — LOSARTAN POTASSIUM 50 MG PO TABS: 50.0000 mg | ORAL_TABLET | Freq: Every day | ORAL | 1 refills | Status: DC

## 2023-09-05 NOTE — Assessment & Plan Note (Addendum)
 Controlled.   Commended patient on progress.   Discussed DASH diet, reduction in alcohol intake and increase exercise. Discussed orthostatic hypotension and when to report.  Continue Losartan  50 mg once daily and hydrochlorothiazide  25 mg once daily.   BMP and testosterone levels pending.   Follow up in 6 months.

## 2023-09-05 NOTE — Patient Instructions (Addendum)
 Stop by the lab prior to leaving today. I will notify you of your results once received.   Follow up in 6 months.   It was a pleasure to see you today!

## 2023-09-05 NOTE — Progress Notes (Signed)
 Established Patient Office Visit  Subjective   Patient ID: Danny [name], male    DOB: Apr 16, 1996  Age: 28 y.o. MRN: [id number]  Chief Complaint  Patient presents with   Hypertension    Patient here today for BP follow and BP has been doing well per patient.     Hypertension Pertinent negatives include no chest pain, headaches or shortness of breath.   Danny [name] is a 28 year old male with past medical history of HTN, anixety, alcohol abuse, atypical chest pain presents today for a follow up for hypertension.   HTN: He was evaluated on 08/22/23 when his home readings were slightly improved with hydrochlorothiazide  25 mg. Losartan  50mg  once daily was added. He was advised to check BP at home and report readings in one week. He has not sent in any home readings. Today he reports that his home BP readings have been in 120's/80's. He has been taking both medications in the morning. He denies any dizziness, orthostatic hypotension, chest pain or shortness of breath. He has been worried about his testosterone levels since starting this medication. He has a history of anxiety and depression as well and has been managed on propanolol. Overall doing well, denies SI/HI.   Patient Active Problem List   Diagnosis Date Noted   Atypical chest pain 06/19/2023   Class 1 obesity due to excess calories with body mass index (BMI) of 30.0 to 30.9 in adult 05/31/2023   Encounter for screening and preventative care 05/31/2023   Hypertension 05/31/2023   Anxiety 05/31/2023   Alcohol use 05/31/2023   Past Medical History:  Diagnosis Date   Allergy    GERD (gastroesophageal reflux disease)    Hypertension    Vertigo    Past Surgical History:  Procedure Laterality Date   ABDOMINAL WALL DEFECT REPAIR     No Known Allergies       09/05/2023    9:02 AM 08/22/2023   12:52 PM 06/19/2023    3:02 PM  Depression screen PHQ 2/9  Decreased Interest 1 0 0  Down, Depressed, Hopeless 0 0 0  PHQ - 2 Score 1 0 0   Altered sleeping 1 2 1   Tired, decreased energy 1 1 1   Change in appetite 0 0 1  Feeling bad or failure about yourself  0 0 0  Trouble concentrating 0 1 1  Moving slowly or fidgety/restless 0 2 0  Suicidal thoughts 0 0 0  PHQ-9 Score 3 6 4   Difficult doing work/chores Not difficult at all Not difficult at all Not difficult at all       09/05/2023    9:02 AM 08/22/2023   12:52 PM 06/19/2023    3:02 PM 05/31/2023   10:43 AM  GAD 7 : Generalized Anxiety Score  Nervous, Anxious, on Edge 1 1 2 1   Control/stop worrying 0 1 1 2   Worry too much - different things 0 1 1 1   Trouble relaxing 1 1 2 1   Restless 1 1 1  0  Easily annoyed or irritable 0 0 0 0  Afraid - awful might happen 0 0 1 1  Total GAD 7 Score 3 5 8 6   Anxiety Difficulty Not difficult at all Not difficult at all Somewhat difficult Somewhat difficult      Review of Systems  Constitutional:  Negative for chills and fever.  Respiratory:  Negative for shortness of breath.   Cardiovascular:  Negative for chest pain.  Gastrointestinal:  Negative for abdominal  pain, constipation, diarrhea, heartburn, nausea and vomiting.  Genitourinary:  Negative for dysuria, frequency and urgency.  Neurological:  Negative for dizziness and headaches.  Endo/Heme/Allergies:  Negative for polydipsia.  Psychiatric/Behavioral:  Negative for depression and suicidal ideas. The patient is not nervous/anxious.       Objective:     BP 112/82 (BP Location: Left Arm, Patient Position: Sitting, Cuff Size: Normal)   Pulse 65   Temp 97.9 F (36.6 C) (Oral)   Ht 5' 10.25" (1.784 m)   Wt 209 lb (94.8 kg)   SpO2 97%   BMI 29.78 kg/m  BP Readings from Last 3 Encounters:  09/05/23 112/82  08/22/23 126/82  06/19/23 122/74   Wt Readings from Last 3 Encounters:  09/05/23 209 lb (94.8 kg)  08/22/23 209 lb (94.8 kg)  06/19/23 213 lb (96.6 kg)      Physical Exam Vitals and nursing note reviewed.  Constitutional:      Appearance: Normal appearance.   Cardiovascular:     Rate and Rhythm: Normal rate and regular rhythm.     Pulses: Normal pulses.     Heart sounds: Normal heart sounds.  Pulmonary:     Effort: Pulmonary effort is normal.     Breath sounds: Normal breath sounds.  Neurological:     Mental Status: He is alert and oriented to person, place, and time.  Psychiatric:        Mood and Affect: Mood normal.        Behavior: Behavior normal.        Thought Content: Thought content normal.        Judgment: Judgment normal.      No results found for any visits on 09/05/23.     The ASCVD Risk score (Arnett DK, et al., 2019) failed to calculate for the following reasons:   The 2019 ASCVD risk score is only valid for ages 77 to 57    Assessment & Plan:  Primary hypertension Assessment & Plan: Controlled.   Commended patient on progress.   Discussed DASH diet, reduction in alcohol intake and increase exercise. Discussed orthostatic hypotension and when to report.  Continue Losartan  50 mg once daily and hydrochlorothiazide  25 mg once daily.   BMP and testosterone levels pending.   Follow up in 6 months.   Orders: -     Testosterone -     Basic metabolic panel with GFR -     Losartan  Potassium; Take 1 tablet (50 mg total) by mouth daily.  Dispense: 90 tablet; Refill: 1  Screening for HIV (human immunodeficiency virus) -     HIV Antibody (routine testing w rflx)     Return in about 6 months (around 03/07/2024) for hypertension.    Jolanda Nation, NP

## 2023-09-06 LAB — HIV ANTIBODY (ROUTINE TESTING W REFLEX): HIV 1&2 Ab, 4th Generation: NONREACTIVE

## 2023-09-19 ENCOUNTER — Other Ambulatory Visit: Payer: Self-pay | Admitting: General Practice

## 2023-09-19 DIAGNOSIS — F419 Anxiety disorder, unspecified: Secondary | ICD-10-CM

## 2023-09-19 DIAGNOSIS — I10 Essential (primary) hypertension: Secondary | ICD-10-CM

## 2023-11-18 ENCOUNTER — Other Ambulatory Visit: Payer: Self-pay | Admitting: General Practice

## 2023-11-18 DIAGNOSIS — I10 Essential (primary) hypertension: Secondary | ICD-10-CM

## 2023-11-21 ENCOUNTER — Other Ambulatory Visit: Payer: Self-pay | Admitting: General Practice

## 2023-11-21 DIAGNOSIS — F419 Anxiety disorder, unspecified: Secondary | ICD-10-CM

## 2023-12-22 ENCOUNTER — Other Ambulatory Visit: Payer: Self-pay | Admitting: General Practice

## 2023-12-22 DIAGNOSIS — F419 Anxiety disorder, unspecified: Secondary | ICD-10-CM

## 2024-03-07 ENCOUNTER — Ambulatory Visit: Admitting: General Practice

## 2024-03-12 ENCOUNTER — Ambulatory Visit: Admitting: General Practice

## 2024-03-12 DIAGNOSIS — I10 Essential (primary) hypertension: Secondary | ICD-10-CM

## 2024-05-21 ENCOUNTER — Other Ambulatory Visit: Payer: Self-pay | Admitting: General Practice

## 2024-05-21 DIAGNOSIS — I10 Essential (primary) hypertension: Secondary | ICD-10-CM
